# Patient Record
Sex: Female | Born: 1970 | Race: White | Hispanic: No | Marital: Married | Smoking: Never smoker
Health system: Southern US, Community
[De-identification: ages and names within clinical notes are randomized; demographics above are authoritative.]

## PROBLEM LIST (undated history)

## (undated) ENCOUNTER — Emergency Department: Payer: Self-pay

## (undated) DIAGNOSIS — I10 Essential (primary) hypertension: Secondary | ICD-10-CM

## (undated) DIAGNOSIS — O1490 Unspecified pre-eclampsia, unspecified trimester: Secondary | ICD-10-CM

## (undated) DIAGNOSIS — E282 Polycystic ovarian syndrome: Secondary | ICD-10-CM

## (undated) DIAGNOSIS — D352 Benign neoplasm of pituitary gland: Secondary | ICD-10-CM

## (undated) DIAGNOSIS — M4300 Spondylolysis, site unspecified: Secondary | ICD-10-CM

## (undated) DIAGNOSIS — E669 Obesity, unspecified: Secondary | ICD-10-CM

## (undated) DIAGNOSIS — Z8659 Personal history of other mental and behavioral disorders: Secondary | ICD-10-CM

## (undated) DIAGNOSIS — Z8759 Personal history of other complications of pregnancy, childbirth and the puerperium: Secondary | ICD-10-CM

## (undated) DIAGNOSIS — E1165 Type 2 diabetes mellitus with hyperglycemia: Secondary | ICD-10-CM

## (undated) DIAGNOSIS — K5730 Diverticulosis of large intestine without perforation or abscess without bleeding: Secondary | ICD-10-CM

## (undated) DIAGNOSIS — D1779 Benign lipomatous neoplasm of other sites: Secondary | ICD-10-CM

## (undated) HISTORY — DX: Unspecified pre-eclampsia, unspecified trimester: O14.90

## (undated) HISTORY — DX: Type 2 diabetes mellitus with hyperglycemia: E11.65

## (undated) HISTORY — DX: Polycystic ovarian syndrome: E28.2

## (undated) HISTORY — DX: Personal history of other mental and behavioral disorders: Z86.59

## (undated) HISTORY — DX: Personal history of other complications of pregnancy, childbirth and the puerperium: Z87.59

## (undated) HISTORY — DX: Obesity, unspecified: E66.9

## (undated) HISTORY — DX: Essential (primary) hypertension: I10

## (undated) HISTORY — DX: Benign neoplasm of pituitary gland: D35.2

---
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
## 2005-02-10 ENCOUNTER — Encounter: Admission: RE | Admit: 2005-02-10 | Discharge: 2005-02-10 | Payer: Self-pay | Admitting: Specialist

## 2009-04-28 ENCOUNTER — Ambulatory Visit: Payer: Self-pay | Admitting: Occupational Medicine

## 2009-04-28 DIAGNOSIS — L02619 Cutaneous abscess of unspecified foot: Secondary | ICD-10-CM | POA: Insufficient documentation

## 2009-04-28 DIAGNOSIS — L03039 Cellulitis of unspecified toe: Secondary | ICD-10-CM | POA: Insufficient documentation

## 2009-05-11 ENCOUNTER — Ambulatory Visit: Payer: Self-pay | Admitting: Occupational Medicine

## 2009-05-11 DIAGNOSIS — J019 Acute sinusitis, unspecified: Secondary | ICD-10-CM | POA: Insufficient documentation

## 2009-10-06 ENCOUNTER — Ambulatory Visit: Payer: Self-pay | Admitting: Family Medicine

## 2009-10-06 DIAGNOSIS — J029 Acute pharyngitis, unspecified: Secondary | ICD-10-CM | POA: Insufficient documentation

## 2009-10-07 ENCOUNTER — Encounter: Payer: Self-pay | Admitting: Family Medicine

## 2010-09-07 NOTE — Assessment & Plan Note (Signed)
Summary: Gail Black, EARACHE & SORE THROAT/KH   Vital Signs:  Patient Profile:   40 Years Old Female CC:      Fever, sore throat, ear congestion x 2 days Height:     68 inches Weight:      275 pounds O2 Sat:      97 % O2 treatment:    Room Air Temp:     99.0 degrees F oral Pulse rate:   90 / minute Pulse rhythm:   regular Resp:     16 per minute BP sitting:   149 / 104  (right arm) Cuff size:   large  Vitals Entered By: Emilio Math (October 06, 2009 10:31 AM)                  Current Allergies: No known allergies History of Present Illness Chief Complaint: Fever, sore throat, ear congestion x 2 days History of Present Illness: Subjective: Patient complains of sore throat for 2 days, now somewhat worse.  Developed right earache last night. No cough No pleuritic pain No wheezing + mild nasal congestion ? post-nasal drainage  No Gail Black pain/pressure No itchy/red eyes No hemoptysis No SOB No fever, + chills + nausea No vomiting No abdominal pain No diarrhea No skin rashes + fatigue No myalgias No headache    Current Meds * PRENATE once a day TYLENOL 325 MG TABS (ACETAMINOPHEN) 2 prn PENICILLIN V POTASSIUM 500 MG TABS (PENICILLIN V POTASSIUM) 1 by mouth q8hr for 10 days  REVIEW OF SYSTEMS Constitutional Symptoms       Complains of fever and chills.     Denies night sweats, weight loss, weight gain, and fatigue.  Eyes       Denies change in vision, eye pain, eye discharge, glasses, contact lenses, and eye surgery. Ear/Nose/Throat/Mouth       Complains of ear pain, Gail Black problems, and sore throat.      Denies hearing loss/aids, change in hearing, ear discharge, dizziness, frequent runny nose, frequent nose bleeds, hoarseness, and tooth pain or bleeding.  Respiratory       Denies dry cough, productive cough, wheezing, shortness of breath, asthma, bronchitis, and emphysema/COPD.  Cardiovascular       Denies murmurs, chest pain, and tires easily with exhertion.     Gastrointestinal       Denies stomach pain, nausea/vomiting, diarrhea, constipation, blood in bowel movements, and indigestion. Genitourniary       Denies painful urination, kidney stones, and loss of urinary control. Neurological       Denies paralysis, seizures, and fainting/blackouts. Musculoskeletal       Denies muscle pain, joint pain, joint stiffness, decreased range of motion, redness, swelling, muscle weakness, and gout.  Skin       Denies bruising, unusual mles/lumps or sores, and hair/skin or nail changes.  Psych       Denies mood changes, temper/anger issues, anxiety/stress, speech problems, depression, and sleep problems.  Past History:  Past Medical History: Reviewed history from 04/28/2009 and no changes required. Unremarkable  Past Surgical History: Reviewed history from 04/28/2009 and no changes required. c-section 01/06/09  Family History: Reviewed history from 05/11/2009 and no changes required. Mother, Healthy [place], Healthy  Social History: Reviewed history from 04/28/2009 and no changes required. Alcohol use-yes once or twice a month Drug use-no tobacco use - no   Objective:  No acute distress  Eyes:  Pupils are equal, round, and reactive to light and accomdation.  Extraocular movement is intact.  Conjunctivae are not inflamed.  Ears:  Canals normal.  Tympanic membranes normal.   Nose:  Normal septum.  Normal turbinates, mildly congested.   No Gail Black tenderness present.  Pharynx:  Erythema and exudate bilaterally Neck:  Supple.  Tender enlarged anterior nodes are palpated bilaterally.  Lungs:  Clear to auscultation.  Breath sounds are equal.  Heart:  Regular rate and rhythm without murmurs, rubs, or gallops.  Abdomen:  Nontender without masses or hepatosplenomegaly.  Bowel sounds are present.  No CVA or flank tenderness.  Rapid strep test negative  Assessment New Problems: ACUTE PHARYNGITIS (ICD-462)  Despite negative strep test, suspect either  Group A or non group A strep infection.  Plan New Medications/Changes: PENICILLIN V POTASSIUM 500 MG TABS (PENICILLIN V POTASSIUM) 1 by mouth q8hr for 10 days  #30 x 0, 10/06/2009, Donna Christen MD  New Orders: Rapid Strep [87880] T-Culture, Throat [[id number]] Est. Patient Level III [09811] Planning Comments:   Throat culture pending. Begin penicillin for 10 days.  Warm saline gargles.  Ibuprofen 600 or 800mg  every 8hr Follow-up with PCP if not improving   The patient and/or caregiver has been counseled thoroughly with regard to medications prescribed including dosage, schedule, interactions, rationale for use, and possible side effects and they verbalize understanding.  Diagnoses and expected course of recovery discussed and will return if not improved as expected or if the condition worsens. Patient and/or caregiver verbalized understanding.  Prescriptions: PENICILLIN V POTASSIUM 500 MG TABS (PENICILLIN V POTASSIUM) 1 by mouth q8hr for 10 days  #30 x 0   Entered and Authorized by:   Donna Christen MD   Signed by:   Donna Christen MD on 10/06/2009   Method used:   Print then Give to Patient   RxID:   [id number]   Patient Instructions: 1)  Take Ibuprofen 600 or 800mg  every 8 hours for pain/fever. 2)  Try warm salt water gargles

## 2010-09-07 NOTE — Letter (Signed)
Summary: Handout Printed  Printed Handout:  - Rheumatic Fever 

## 2011-04-20 ENCOUNTER — Ambulatory Visit (HOSPITAL_COMMUNITY): Payer: Self-pay | Admitting: Licensed Clinical Social Worker

## 2011-04-28 ENCOUNTER — Ambulatory Visit (HOSPITAL_COMMUNITY): Payer: Self-pay | Admitting: Licensed Clinical Social Worker

## 2011-05-02 ENCOUNTER — Encounter: Payer: Self-pay | Admitting: Family Medicine

## 2011-05-02 ENCOUNTER — Inpatient Hospital Stay (INDEPENDENT_AMBULATORY_CARE_PROVIDER_SITE_OTHER)
Admission: RE | Admit: 2011-05-02 | Discharge: 2011-05-02 | Disposition: A | Discharge status: Home / Self Care | Payer: Managed Care, Other (non HMO) | Source: Ambulatory Visit | Attending: Family Medicine | Admitting: Family Medicine

## 2011-05-02 DIAGNOSIS — J069 Acute upper respiratory infection, unspecified: Secondary | ICD-10-CM

## 2011-05-02 DIAGNOSIS — L02519 Cutaneous abscess of unspecified hand: Secondary | ICD-10-CM

## 2011-05-06 ENCOUNTER — Encounter: Payer: Self-pay | Admitting: Family Medicine

## 2011-07-11 NOTE — Progress Notes (Signed)
Summary: Followu Call  Followup Call to patient:  Discussed wound culture results.  She reports that her hand is improving.  She states that she has an appt with Hydrographic surveyor at [facility].  Advised to continue Keflex. Donna Christen MD  May 06, 2011 9:09 AM

## 2011-07-11 NOTE — Progress Notes (Signed)
Summary: COLD/GLASS IN HAND.[place] Room 5   Vital Signs:  Patient Profile:   40 Years Old Female CC:      Congestion, cough x 4 days/ Foreign body in left hand since December Height:     68 inches Weight:      265 pounds O2 Sat:      96 % O2 treatment:    Room Air Temp:     98.3 degrees F oral Pulse rate:   83 / minute Pulse rhythm:   regular Resp:     18 per minute BP sitting:   194 / 127  (left arm) Cuff size:   large  Vitals Entered By: Emilio Math (May 02, 2011 8:25 PM)                  Current Allergies: No known allergies History of Present Illness Chief Complaint: Congestion, cough x 4 days/ Foreign body in left hand since December History of Present Illness:  Subjective:  Patient presents with two complaints:  1)  About 9 months ago while cleaning a glass, fragments broke off and pentrated her left palm.  She believed that she had removed the fragments at the time of injury, but has had a persistent tender nodule at the site.  About one month ago she developed increased pain and redness at the site, and purulent drainage over the past week.  When she palpates the area there is a sharp pain suggesting a retained piece of glass.  Her tetanus immunization is current. 2)  Two days ago she developed cold-like symptoms with sore throat, nasal congestion, and a cough.  She has had a low grade fever.  No pleuritic pain or shortness of breath.  No earache.  No GU or GI symptoms.  Several family members have similar illness.  Current Meds * PRENATE once a day TYLENOL 325 MG TABS (ACETAMINOPHEN) 2 prn CEPHALEXIN 500 MG TABS (CEPHALEXIN) One by mouth three times daily (every 8 hours)  REVIEW OF SYSTEMS Constitutional Symptoms      Denies fever, chills, night sweats, weight loss, weight gain, and fatigue.  Eyes       Denies change in vision, eye pain, eye discharge, glasses, contact lenses, and eye surgery. Ear/Nose/Throat/Mouth       Complains of sinus problems.      Denies hearing loss/aids, change in hearing, ear pain, ear discharge, dizziness, frequent runny nose, frequent nose bleeds, sore throat, hoarseness, and tooth pain or bleeding.  Respiratory       Denies dry cough, productive cough, wheezing, shortness of breath, asthma, bronchitis, and emphysema/COPD.  Cardiovascular       Denies murmurs, chest pain, and tires easily with exhertion.    Gastrointestinal       Denies stomach pain, nausea/vomiting, diarrhea, constipation, blood in bowel movements, and indigestion. Genitourniary       Denies painful urination, kidney stones, and loss of urinary control. Neurological       Denies paralysis, seizures, and fainting/blackouts. Musculoskeletal       Denies muscle pain, joint pain, joint stiffness, decreased range of motion, redness, swelling, muscle weakness, and gout.  Skin       Complains of unusual moles/lumps or sores.      Denies bruising and hair/skin or nail changes.  Psych       Denies mood changes, temper/anger issues, anxiety/stress, speech problems, depression, and sleep problems. Other Comments: Takes something for Blood pressure   Past History:  Past Medical History: Reviewed history from 04/28/2009 and no changes required. Unremarkable  Past Surgical History: Reviewed history  from 04/28/2009 and no changes required. c-section 01/06/09  Family History: Reviewed history from 05/11/2009 and no changes required. Mother, Healthy [place], Healthy  Social History: Reviewed history from 04/28/2009 and no changes required. Alcohol use-yes once or twice a month Drug use-no tobacco use - no   Objective:  No acute distress  Eyes:  Pupils are equal, round, and reactive to light and accomodation.  Extraocular movement is intact.  Conjunctivae are not inflamed.  Ears:  Canals normal.  Tympanic membranes normal.   Nose:  Mildly congested turbinates.  No sinus tenderness  Pharynx:  Normal  Neck:  Supple.  Slightly tender shotty posterior nodes are palpated bilaterally.  Lungs:  Clear to auscultation.  Breath sounds are equal.  Chest:  Mild tenderness over sternum Heart:  Regular rate and rhythm without murmurs, rubs, or gallops.  Abdomen:  Nontender without masses or hepatosplenomegaly.  Bowel sounds are present.  No CVA or flank tenderness.  Left hand: 1cm dia tender erythematous indurated nodule on palm.  Small amount of sero-sanguinous fluid expressed.  All figers have good range of motion.  Distal neurovascular intact  Assessment New Problems: UPPER RESPIRATORY INFECTION, ACUTE (ICD-465.9) CELLULITIS, HAND, LEFT (ICD-682.4)  ? FOREIGN BODY (GLASS) LEFT PALM VIRAL URI WITH COSTOCHONDRITIS  Plan New Medications/Changes: CEPHALEXIN 500 MG TABS (CEPHALEXIN) One by mouth three times daily (every 8 hours)  #30 x 0, 05/02/2011, Donna Christen MD  New Orders: T-Culture, Wound [[id number]] Pulse Oximetry (single measurment) [94760] Est. Patient Level IV [16109] Planning Comments:   Begin Keflex.  Increase fluid intake.  May continue Afrin and saline nasal spray (she is breast feeding) Wound culture taken of lesion left palm. Begin soaking hand in warm water several times daily.  Keep bandage on lesion Followup with hand surgeon in about one week when cellulitis hand resolved.   The patient and/or caregiver has been counseled thoroughly with regard to medications prescribed including dosage, schedule, interactions, rationale for use, and possible side effects and they verbalize understanding.  Diagnoses and expected course of recovery discussed and will return if not improved as expected or if the condition worsens. Patient and/or caregiver verbalized understanding.  Prescriptions: CEPHALEXIN 500 MG TABS (CEPHALEXIN) One by mouth three times daily (every 8 hours)  #30 x 0   Entered and Authorized by:   Donna Christen MD   Signed by:   Donna Christen MD on 05/02/2011   Method used:   Print then Give to Patient   RxID:   [id number]   Orders Added: 1)  T-Culture, Wound [[id number]] 2)  Pulse Oximetry (single measurment) [94760] 3)  Est. Patient Level IV [95621]

## 2016-11-11 ENCOUNTER — Ambulatory Visit: Payer: Managed Care, Other (non HMO) | Admitting: Physician Assistant

## 2016-11-14 ENCOUNTER — Ambulatory Visit: Payer: Managed Care, Other (non HMO) | Admitting: Physician Assistant

## 2016-11-15 ENCOUNTER — Ambulatory Visit (INDEPENDENT_AMBULATORY_CARE_PROVIDER_SITE_OTHER): Payer: 59 | Admitting: Physician Assistant

## 2016-11-15 ENCOUNTER — Encounter: Payer: Self-pay | Admitting: Physician Assistant

## 2016-11-15 VITALS — BP 230/155 | HR 96 | Ht 67.0 in | Wt 296.0 lb

## 2016-11-15 DIAGNOSIS — E282 Polycystic ovarian syndrome: Secondary | ICD-10-CM

## 2016-11-15 DIAGNOSIS — Z299 Encounter for prophylactic measures, unspecified: Secondary | ICD-10-CM

## 2016-11-15 DIAGNOSIS — I10 Essential (primary) hypertension: Secondary | ICD-10-CM

## 2016-11-15 LAB — CBC
HCT: 40.8 % (ref 35.0–45.0)
HEMOGLOBIN: 13.8 g/dL (ref 11.7–15.5)
MCH: 27.1 pg (ref 27.0–33.0)
MCHC: 33.8 g/dL (ref 32.0–36.0)
MCV: 80 fL (ref 80.0–100.0)
MPV: 10.4 fL (ref 7.5–12.5)
Platelets: 204 10*3/uL (ref 140–400)
RBC: 5.1 MIL/uL (ref 3.80–5.10)
RDW: 14.4 % (ref 11.0–15.0)
WBC: 8.1 10*3/uL (ref 3.8–10.8)

## 2016-11-15 MED ORDER — VALSARTAN-HYDROCHLOROTHIAZIDE 320-25 MG PO TABS: 1.0000 | ORAL_TABLET | Freq: Every day | ORAL | 3 refills | Status: DC

## 2016-11-15 NOTE — Progress Notes (Signed)
HPI:                                                                Gail Black is a 46 y.o. female who presents to [facility] today to establish care   Current Concerns include blood pressure  HTN: Patient states she was diagnosed with HTN in the past, but has never followed up or taken antihypertensive medication. States she was referred here after having very elevated BP at her pre-employment physical this month. Patient reports history of preeclampsia 5 years ago requiring Magnesium and C-section; denies seizure/eclampsia. She states at that time she had a headache. Reports BP's have been elevated at her gynecologists office ever since her diagnosis of preeclampsia. She is asymptomatic today. Denies vision change, headache, chest pain with exertion, orthopnea, lightheadedness, syncope and edema.   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 States she has annual brain MRI's. Last MRI visible in [place] is from 03/16/11 and states pituitary gland is normal in size and appearance with no evidence of adenoma.   Health Maintenance Health Maintenance  Topic Date Due  . HIV Screening  01/23/1988  . TETANUS/TDAP  01/23/1992  . PAP SMEAR  01/22/1994  . INFLUENZA VACCINE  03/08/2017    GYN/Sexual Health  Menstrual status: having periods  LMP: 11/10/16  Menses: regular, PCOS  Last pap smear: 2017 - followed by GYN  History of abnormal pap smears: no  Sexually active: yes, 1 female partner (husband)  Current contraception: tubal  Health Habits  Diet: poor, 2 cups of coffee per day  Exercise: treadmill, walking 1h 2-3 days per week  Past Medical History:  Diagnosis Date  . 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 PCOS (polycystic ovarian syndrome)   . Preeclampsia    Past Surgical History:  Procedure Laterality Date  . CESAREAN SECTION  2013   preeclampsia  . CESAREAN SECTION WITH BILATERAL TUBAL LIGATION     Social History  Substance Use Topics  . Smoking status: Never Smoker  . Smokeless tobacco: Never Used  . Alcohol use Yes   family history includes Breast cancer in her mother; Hypertension in her father.  ROS: negative except as noted in the HPI  Medications: Current Outpatient Prescriptions  Medication Sig Dispense Refill  . valsartan-hydrochlorothiazide (DIOVAN-HCT) 320-25 MG tablet Take 1 tablet by mouth daily. 30 tablet 3   No current facility-administered medications for this visit.    No Known Allergies  Objective:  BP (!) 230/155   Pulse 96   Ht 5\' 7"  (1.702 m)   Wt 296 lb (134.3 kg)   BMI 46.36 kg/m  Gen: well-groomed, morbidly obese, cooperative, not ill-appearing, no distress Pulm: Normal work of breathing, normal phonation, clear to auscultation bilaterally CV: Normal rate, regular rhythm, s1 and s2 distinct, no murmurs, clicks or rubs, no carotid bruit Neuro: alert and oriented x 3, EOM's intact, PERRLA, normal tone, no tremor MSK: moving all extremities, normal gait and station, no peripheral edema Skin: warm and dry, no rashes or lesions on exposed skin Psych: normal affect, euthymic mood, normal speech and thought content  Assessment and Plan: 46 y.o. female with  1. Essential hypertension - BP very elevated today. Patient is asymptomatic. Checking CMP, TSH - starting 1/2 tab of Diovan-HCT with close follow-up in 1 week - patient instructed to monitor pressures at home - patient instructed to return or go to the emergency room if she develops symptoms - provided with DASH eating plan - valsartan-hydrochlorothiazide (DIOVAN-HCT) 320-25 MG tablet; Take 1 tablet by mouth daily.  Dispense: 30 tablet; Refill: 3  2. Encounter for preventive measure - CBC - COMPLETE METABOLIC PANEL WITH GFR - Lipid Panel w/reflex Direct LDL - TSH - MM DIGITAL SCREENING BILATERAL; Future - Pap UTD per  patient - will have records transferred from GYN  3. Severe obesity (BMI >= 40) (HCC) - Amb ref to Medical Nutrition Therapy-MNT - follow-up in 1 week to discuss weight loss  Patient education and anticipatory guidance given Patient agrees with treatment plan Follow-up in 1 week or sooner as needed  Darlyne Russian PA-C

## 2016-11-15 NOTE — Patient Instructions (Addendum)
For your blood pressure: - Take 1/2 tablet daily  - Check blood pressure at home  - Check around the same time each day in a relaxed setting (rest for at least 3 minutes, legs uncrossed, arm above level or your heart) - Limit salt. Follow DASH eating plan - Follow-up in 1 week   DASH Eating Plan DASH stands for "Dietary Approaches to Stop Hypertension." The DASH eating plan is a healthy eating plan that has been shown to reduce high blood pressure (hypertension). It may also reduce your risk for type 2 diabetes, heart disease, and stroke. The DASH eating plan may also help with weight loss. What are tips for following this plan? General guidelines   Avoid eating more than 2,300 mg (milligrams) of salt (sodium) a day. If you have hypertension, you may need to reduce your sodium intake to 1,500 mg a day.  Limit alcohol intake to no more than 1 drink a day for nonpregnant women and 2 drinks a day for men. One drink equals 12 oz of beer, 5 oz of wine, or 1 oz of hard liquor.  Work with your health care provider to maintain a healthy body weight or to lose weight. Ask what an ideal weight is for you.  Get at least 30 minutes of exercise that causes your heart to beat faster (aerobic exercise) most days of the week. Activities may include walking, swimming, or biking.  Work with your health care provider or diet and nutrition specialist (dietitian) to adjust your eating plan to your individual calorie needs. Reading food labels   Check food labels for the amount of sodium per serving. Choose foods with less than 5 percent of the Daily Value of sodium. Generally, foods with less than 300 mg of sodium per serving fit into this eating plan.  To find whole grains, look for the word "whole" as the first word in the ingredient list. Shopping   Buy products labeled as "low-sodium" or "no salt added."  Buy fresh foods. Avoid canned foods and premade or frozen meals. Cooking   Avoid adding salt  when cooking. Use salt-free seasonings or herbs instead of table salt or sea salt. Check with your health care provider or pharmacist before using salt substitutes.  Do not fry foods. Cook foods using healthy methods such as baking, boiling, grilling, and broiling instead.  Cook with heart-healthy oils, such as olive, canola, soybean, or sunflower oil. Meal planning    Eat a balanced diet that includes:  5 or more servings of fruits and vegetables each day. At each meal, try to fill half of your plate with fruits and vegetables.  Up to 6-8 servings of whole grains each day.  Less than 6 oz of lean meat, poultry, or fish each day. A 3-oz serving of meat is about the same size as a deck of cards. One egg equals 1 oz.  2 servings of low-fat dairy each day.  A serving of nuts, seeds, or beans 5 times each week.  Heart-healthy fats. Healthy fats called Omega-3 fatty acids are found in foods such as flaxseeds and coldwater fish, like sardines, salmon, and mackerel.  Limit how much you eat of the following:  Canned or prepackaged foods.  Food that is high in trans fat, such as fried foods.  Food that is high in saturated fat, such as fatty meat.  Sweets, desserts, sugary drinks, and other foods with added sugar.  Full-fat dairy products.  Do not salt foods before eating.  Try to eat at least 2 vegetarian meals each week.  Eat more home-cooked food and less restaurant, buffet, and fast food.  When eating at a restaurant, ask that your food be prepared with less salt or no salt, if possible. What foods are recommended? The items listed may not be a complete list. Talk with your dietitian about what dietary choices are best for you. Grains  Whole-grain or whole-wheat bread. Whole-grain or whole-wheat pasta. Brown rice. [place]. Bulgur. Whole-grain and low-sodium cereals. Pita bread. Low-fat, low-sodium crackers. Whole-wheat flour tortillas. Vegetables  Fresh or frozen  vegetables (raw, steamed, roasted, or grilled). Low-sodium or reduced-sodium tomato and vegetable juice. Low-sodium or reduced-sodium tomato sauce and tomato paste. Low-sodium or reduced-sodium canned vegetables. Fruits  All fresh, dried, or frozen fruit. Canned fruit in natural juice (without added sugar). Meat and other protein foods  Skinless chicken or [place]. Ground chicken or [place]. Pork with fat trimmed off. Fish and seafood. Egg whites. Dried beans, peas, or lentils. Unsalted nuts, nut butters, and seeds. Unsalted canned beans. Lean cuts of beef with fat trimmed off. Low-sodium, lean deli meat. Dairy  Low-fat (1%) or fat-free (skim) milk. Fat-free, low-fat, or reduced-fat cheeses. Nonfat, low-sodium ricotta or cottage cheese. Low-fat or nonfat yogurt. Low-fat, low-sodium cheese. Fats and oils  Soft margarine without trans fats. Vegetable oil. Low-fat, reduced-fat, or light mayonnaise and salad dressings (reduced-sodium). Canola, safflower, olive, soybean, and sunflower oils. Avocado. Seasoning and other foods  Herbs. Spices. Seasoning mixes without salt. Unsalted popcorn and pretzels. Fat-free sweets. What foods are not recommended? The items listed may not be a complete list. Talk with your dietitian about what dietary choices are best for you. Grains  Baked goods made with fat, such as croissants, muffins, or some breads. Dry pasta or rice meal packs. Vegetables  Creamed or fried vegetables. Vegetables in a cheese sauce. Regular canned vegetables (not low-sodium or reduced-sodium). Regular canned tomato sauce and paste (not low-sodium or reduced-sodium). Regular tomato and vegetable juice (not low-sodium or reduced-sodium). Angie Fava. Olives. Fruits  Canned fruit in a light or heavy syrup. Fried fruit. Fruit in cream or butter sauce. Meat and other protein foods  Fatty cuts of meat. Ribs. Fried meat. Berniece Salines. Sausage. Bologna and other processed lunch meats. Salami. Fatback. Hotdogs.  Bratwurst. Salted nuts and seeds. Canned beans with added salt. Canned or smoked fish. Whole eggs or egg yolks. Chicken or [place] with skin. Dairy  Whole or 2% milk, cream, and half-and-half. Whole or full-fat cream cheese. Whole-fat or sweetened yogurt. Full-fat cheese. Nondairy creamers. Whipped toppings. Processed cheese and cheese spreads. Fats and oils  Butter. Stick margarine. Lard. Shortening. Ghee. Bacon fat. Tropical oils, such as coconut, palm kernel, or palm oil. Seasoning and other foods  Salted popcorn and pretzels. Onion salt, garlic salt, seasoned salt, table salt, and sea salt. Worcestershire sauce. Tartar sauce. Barbecue sauce. Teriyaki sauce. Soy sauce, including reduced-sodium. Steak sauce. Canned and packaged gravies. Fish sauce. Oyster sauce. Cocktail sauce. Horseradish that you find on the shelf. Ketchup. Mustard. Meat flavorings and tenderizers. Bouillon cubes. Hot sauce and Tabasco sauce. Premade or packaged marinades. Premade or packaged taco seasonings. Relishes. Regular salad dressings. Where to find more information:  National Heart, Lung, and [place]: https://wilson-eaton.com/  American Heart Association: www.heart.org Summary  The DASH eating plan is a healthy eating plan that has been shown to reduce high blood pressure (hypertension). It may also reduce your risk for type 2 diabetes, heart disease, and stroke.  With the DASH  eating plan, you should limit salt (sodium) intake to 2,300 mg a day. If you have hypertension, you may need to reduce your sodium intake to 1,500 mg a day.  When on the DASH eating plan, aim to eat more fresh fruits and vegetables, whole grains, lean proteins, low-fat dairy, and heart-healthy fats.  Work with your health care provider or diet and nutrition specialist (dietitian) to adjust your eating plan to your individual calorie needs. This information is not intended to replace advice given to you by your health care provider. Make sure  you discuss any questions you have with your health care provider. Document Released: 07/14/2011 Document Revised: 07/18/2016 Document Reviewed: 07/18/2016 Elsevier Interactive Patient Education  2017 [employer].

## 2016-11-16 ENCOUNTER — Encounter: Payer: Self-pay | Admitting: Physician Assistant

## 2016-11-16 DIAGNOSIS — I10 Essential (primary) hypertension: Secondary | ICD-10-CM | POA: Insufficient documentation

## 2016-11-16 DIAGNOSIS — E282 Polycystic ovarian syndrome: Secondary | ICD-10-CM | POA: Insufficient documentation

## 2016-11-16 LAB — COMPLETE METABOLIC PANEL WITH GFR
ALBUMIN: 4.2 g/dL (ref 3.6–5.1)
ALK PHOS: 62 U/L (ref 33–115)
ALT: 25 U/L (ref 6–29)
AST: 15 U/L (ref 10–30)
BILIRUBIN TOTAL: 0.5 mg/dL (ref 0.2–1.2)
BUN: 14 mg/dL (ref 7–25)
CO2: 25 mmol/L (ref 20–31)
CREATININE: 0.77 mg/dL (ref 0.50–1.10)
Calcium: 9.1 mg/dL (ref 8.6–10.2)
Chloride: 103 mmol/L (ref 98–110)
GFR, Est African American: >89 mL/min (ref >=60)
GLUCOSE: 154 mg/dL — AB (ref 65–99)
Potassium: 3.7 mmol/L (ref 3.5–5.3)
SODIUM: 139 mmol/L (ref 135–146)
TOTAL PROTEIN: 7.1 g/dL (ref 6.1–8.1)

## 2016-11-16 LAB — LIPID PANEL W/REFLEX DIRECT LDL
Cholesterol: 198 mg/dL (ref <200)
HDL: 46 mg/dL — ABNORMAL LOW (ref >50)
LDL-CHOLESTEROL: 124 mg/dL — AB
Non-HDL Cholesterol (Calc): 152 mg/dL — ABNORMAL HIGH (ref <130)
Total CHOL/HDL Ratio: 4.3 Ratio (ref <5.0)
Triglycerides: 167 mg/dL — ABNORMAL HIGH (ref <150)

## 2016-11-16 LAB — TSH: TSH: 2.55 m[IU]/L

## 2016-11-22 ENCOUNTER — Ambulatory Visit (INDEPENDENT_AMBULATORY_CARE_PROVIDER_SITE_OTHER): Payer: 59 | Admitting: Physician Assistant

## 2016-11-22 VITALS — BP 196/133 | HR 84 | Wt 300.0 lb

## 2016-11-22 DIAGNOSIS — E282 Polycystic ovarian syndrome: Secondary | ICD-10-CM | POA: Diagnosis not present

## 2016-11-22 DIAGNOSIS — Z79899 Other long term (current) drug therapy: Secondary | ICD-10-CM

## 2016-11-22 DIAGNOSIS — I10 Essential (primary) hypertension: Secondary | ICD-10-CM | POA: Diagnosis not present

## 2016-11-22 DIAGNOSIS — L680 Hirsutism: Secondary | ICD-10-CM | POA: Diagnosis not present

## 2016-11-22 DIAGNOSIS — Z5181 Encounter for therapeutic drug level monitoring: Secondary | ICD-10-CM | POA: Diagnosis not present

## 2016-11-22 MED ORDER — SPIRONOLACTONE 50 MG PO TABS: 50.0000 mg | ORAL_TABLET | Freq: Every day | ORAL | 3 refills | Status: DC

## 2016-11-22 MED ORDER — METFORMIN HCL ER 500 MG PO TB24: 500.0000 mg | ORAL_TABLET | Freq: Every day | ORAL | 3 refills | Status: DC

## 2016-11-22 NOTE — Patient Instructions (Addendum)
For your blood pressure: - Increase to a full tab daily - Cont to check blood pressure at home for the next 2 weeks - Check around the same time each day in a relaxed setting (rest for at least 3 minutes, legs uncrossed, arm above level or your heart) - Limit salt. Follow DASH eating plan - Follow-up for a nurse visit BP check in 2 weeks   - Take Metformin with your evening meal  - Take Spironolactone 1/2 tab twice a day

## 2016-11-22 NOTE — Progress Notes (Signed)
HPI:                                                                Gail Black is a 46 y.o. female who presents to [place]: [place] today for blood pressure follow-up  HTN: taking Valsartan-HCTZ daily. Compliant with medications. Checks BP's at home. BP range 180-210/88-101. Denies vision change, headache, chest pain with exertion, orthopnea, lightheadedness, syncope and edema.   Obesity (BMI 46): Patient states she has started going to the gym and eating a healthier diet. She is cooking all of her meals at home. She does endorse consuming a few glasses of wine over the weekend. She has an appointment with nutritionist early next month.  Past Medical History:  Diagnosis Date  . 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 Hypertension   . Obesity   . PCOS (polycystic ovarian syndrome)   . Preeclampsia    Past Surgical History:  Procedure Laterality Date  . CESAREAN SECTION  2013   preeclampsia  . CESAREAN SECTION WITH BILATERAL TUBAL LIGATION     Social History  Substance Use Topics  . Smoking status: Never Smoker  . Smokeless tobacco: Never Used  . Alcohol use 0.6 oz/week    1 Glasses of wine per week   family history includes Breast cancer in her mother; Hypertension in her father.  ROS: negative except as noted in the HPI  Medications: Current Outpatient Prescriptions  Medication Sig Dispense Refill  . valsartan-hydrochlorothiazide (DIOVAN-HCT) 320-25 MG tablet Take 1 tablet by mouth daily. (Patient taking differently: Take 0.5 tablets by mouth daily. ) 30 tablet 3  . metFORMIN (GLUCOPHAGE XR) 500 MG 24 hr tablet Take 1 tablet (500 mg total) by mouth daily with supper. 30 tablet 3  . spironolactone (ALDACTONE) 50 MG tablet Take 1 tablet (50 mg total) by mouth daily. 30 tablet 3   No current facility-administered medications for this visit.    No Known Allergies     Objective:  BP (!) 196/133   Pulse 84   Wt 300 lb  (136.1 kg)   BMI 46.99 kg/m  Gen: well-groomed, cooperative, not ill-appearing, no distress Pulm: Normal work of breathing, normal phonation, clear to auscultation bilaterally CV: Normal rate, regular rhythm, s1 and s2 distinct, no murmurs, clicks or rubs  Neuro: alert and oriented x 3, EOM's intact, no tremor MSK: moving all extremities, normal gait and station, no peripheral edema Psych: good eye contact, normal affect, euthymic mood, normal speech and thought content Skin: acne present on chin and submental region    No results found for this or any previous visit (from the past 72 hour(s)). No results found.    Assessment and Plan: 46 y.o. female with   1. Essential hypertension - BP still out of range on both home and clinic readings. She is asymptomatic. - increasing Valsaran-HCT to 320-25mg  daily - follow-up in 2 weeks  2. PCOS (polycystic ovarian syndrome) - metFORMIN (GLUCOPHAGE XR) 500 MG 24 hr tablet; Take 1 tablet (500 mg total) by mouth daily with supper.  Dispense: 30 tablet; Refill: 3  3. Severe obesity (BMI >= 40) (HCC) - metFORMIN (GLUCOPHAGE XR) 500 MG 24 hr tablet; Take 1 tablet (500 mg total) by mouth  daily with supper.  Dispense: 30 tablet; Refill: 3 - follow-up with medical nutritionist - cont regular cardiovascular exercise  4. 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 This is causing her emotional distress and she has seen dermatology for this. She was prescribed some topical cream, but would like to try Spironolactone - spironolactone (ALDACTONE) 50 MG tablet; Take 1 tablet (50 mg total) by mouth daily.  Dispense: 30 tablet; Refill: 3 - educated patient on importance of good hydration since she is on two diuretics. Discussed risk of hypokalemia - will check potassium in 1 month   Patient education and anticipatory guidance given Patient agrees with treatment plan Follow-up in 2 weeks or sooner as needed   Darlyne Russian PA-C

## 2016-11-25 ENCOUNTER — Encounter: Payer: Self-pay | Admitting: Physician Assistant

## 2016-11-25 ENCOUNTER — Telehealth: Payer: Self-pay

## 2016-11-25 NOTE — Telephone Encounter (Signed)
Recommendations left on vm -EH/RMA  

## 2016-11-25 NOTE — Telephone Encounter (Signed)
-----   Message from [facility] [place] sent at 11/23/2016 10:28 AM EDT ----- Can you let patient know, since we started her on Spironolactone, I would like her to have her potassium checked in 4 weeks. She can pick up her lab slip at her nurse visit.

## 2016-12-06 ENCOUNTER — Ambulatory Visit (INDEPENDENT_AMBULATORY_CARE_PROVIDER_SITE_OTHER): Payer: 59 | Admitting: Physician Assistant

## 2016-12-06 ENCOUNTER — Other Ambulatory Visit: Payer: Self-pay

## 2016-12-06 VITALS — BP 196/104 | HR 102 | Wt 299.0 lb

## 2016-12-06 DIAGNOSIS — Z79899 Other long term (current) drug therapy: Secondary | ICD-10-CM

## 2016-12-06 DIAGNOSIS — I10 Essential (primary) hypertension: Secondary | ICD-10-CM

## 2016-12-06 DIAGNOSIS — Z5181 Encounter for therapeutic drug level monitoring: Secondary | ICD-10-CM

## 2016-12-06 MED ORDER — METOPROLOL SUCCINATE ER 50 MG PO TB24: 50.0000 mg | ORAL_TABLET | Freq: Every day | ORAL | 5 refills | Status: DC

## 2016-12-06 NOTE — Progress Notes (Signed)
Patient's BP still hypertensive and tachycardic on max dose of valsartan-hydrochlorothiazide daily. Adding Toprol XL 50mg  daily.

## 2016-12-06 NOTE — Progress Notes (Signed)
Pt came into clinic today for NV BP check. Pt reports at last OV she was advised to start taking a full tab of the Diovan-HCT 320-25mg . Pt reports no side effects. Denies any palpitations, chest pain, dizziness, blurred vision. Pt does state she feels as thought her blood sugar is dropping in the mornings. Advised she get a glucometer and start checking it in the am. Since Pt does not have diagnosis for DM, she will pay for this OOP. Pt's BP was elevated in office today, Pt also reports at home this am it was 201/90. Spoke with PCP, added Rx for Toprolol 50mg  daily. Pt advised of new Rx addition and to follow up in 2 weeks for NV BP check.

## 2016-12-07 LAB — BASIC METABOLIC PANEL WITH GFR
BUN: 22 mg/dL (ref 7–25)
CO2: 20 mmol/L (ref 20–31)
CREATININE: 1.05 mg/dL (ref 0.50–1.10)
Calcium: 9 mg/dL (ref 8.6–10.2)
Chloride: 105 mmol/L (ref 98–110)
GFR, EST AFRICAN AMERICAN: 75 mL/min (ref >=60)
GFR, Est Non African American: 65 mL/min (ref >=60)
Glucose, Bld: 121 mg/dL — ABNORMAL HIGH (ref 65–99)
Potassium: 4.1 mmol/L (ref 3.5–5.3)
Sodium: 137 mmol/L (ref 135–146)

## 2016-12-20 ENCOUNTER — Ambulatory Visit: Payer: 59

## 2016-12-22 ENCOUNTER — Ambulatory Visit: Payer: 59

## 2017-01-18 ENCOUNTER — Ambulatory Visit (INDEPENDENT_AMBULATORY_CARE_PROVIDER_SITE_OTHER): Payer: 59 | Admitting: Physician Assistant

## 2017-01-18 ENCOUNTER — Encounter: Payer: Self-pay | Admitting: Physician Assistant

## 2017-01-18 VITALS — BP 187/113 | HR 61 | Wt 306.0 lb

## 2017-01-18 DIAGNOSIS — F4323 Adjustment disorder with mixed anxiety and depressed mood: Secondary | ICD-10-CM | POA: Diagnosis not present

## 2017-01-18 DIAGNOSIS — I10 Essential (primary) hypertension: Secondary | ICD-10-CM

## 2017-01-18 MED ORDER — ALPRAZOLAM 1 MG PO TABS: 0.5000 mg | ORAL_TABLET | Freq: Two times a day (BID) | ORAL | 0 refills | Status: DC | PRN

## 2017-01-18 MED ORDER — VILAZODONE HCL 10 & 20 MG PO KIT: 1.0000 | PACK | Freq: Every day | ORAL | 0 refills | Status: DC

## 2017-01-18 NOTE — Patient Instructions (Addendum)
-   Plan to go to the lab downstairs at 7:30-8:00 am for blood work and urine sample. Drink plenty of water beforehand - You will get a phone call to schedule your ultrasound - Continue your daily medications - Follow-up in 2 weeks

## 2017-01-18 NOTE — Progress Notes (Signed)
HPI:                                                                Gail Black is a 46 y.o. female who presents to [place]: [place] today for HTN, anxiety  HTN: taking valsartan-hctz and Metoprolol XL daily. Compliant with medications. Checks BP's at home. Patient reports average readings 130's/80's, but does report multiple readings as high as 190/110. She does endorse occasional episodes where she becomes diaphoretic, has palpitations and feels anxious. Denies vision change, headache, chest pain with exertion, orthopnea, lightheadedness, syncope and edema. Risk factors include: severe obesity, prediabetes  Depression/Anxiety: patient reports stress at work and grief related to recent unexpected death of her father. She endorses severe anxiety that is interfering with her ability to perform her job. She endorses excessive worry, restlessness, palpitations, and sleep disturbance. Reports she has been on Zoloft in the past for postpartum depression. Denies symptoms of mania/hypomania. Denies suicidal thinking. Denies auditory/visual hallucinations.   Past Medical History:  Diagnosis Date  . 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 Hypertension   . Obesity   . PCOS (polycystic ovarian syndrome)   . Preeclampsia    Past Surgical History:  Procedure Laterality Date  . CESAREAN SECTION  2013   preeclampsia  . CESAREAN SECTION WITH BILATERAL TUBAL LIGATION     Social History  Substance Use Topics  . Smoking status: Never Smoker  . Smokeless tobacco: Never Used  . Alcohol use 0.6 oz/week    1 Glasses of wine per week   family history includes Breast cancer in her mother; Hypertension in her father.  ROS: negative except as noted in the HPI  Medications: Current Outpatient Prescriptions  Medication Sig Dispense Refill  . metFORMIN (GLUCOPHAGE XR) 500 MG 24 hr tablet Take 1 tablet (500 mg total) by mouth daily with supper. 30 tablet 3   . metoprolol succinate (TOPROL XL) 50 MG 24 hr tablet Take 1 tablet (50 mg total) by mouth daily. Take with or immediately following a meal. 30 tablet 5  . spironolactone (ALDACTONE) 50 MG tablet Take 1 tablet (50 mg total) by mouth daily. 30 tablet 3  . valsartan-hydrochlorothiazide (DIOVAN-HCT) 320-25 MG tablet Take 1 tablet by mouth daily. 30 tablet 3  . ALPRAZolam (XANAX) 1 MG tablet Take 0.5-1 tablets (0.5-1 mg total) by mouth 2 (two) times daily as needed for anxiety. 20 tablet 0  . Vilazodone HCl (VIIBRYD STARTER PACK) 10 & 20 MG KIT Take 1 tablet by mouth daily after breakfast. Use as directed, 4 weeks QS 1 kit 0   No current facility-administered medications for this visit.    No Known Allergies     Objective:  BP (!) 187/113   Pulse 61   Wt (!) 306 lb (138.8 kg)   BMI 47.93 kg/m  Gen:  Morbidly obese, not ill-appearing, no acute distress Pulm: Normal work of breathing, normal phonation, clear to auscultation bilaterally CV: Normal rate, regular rhythm, s1 and s2 distinct, no murmurs, clicks or rubs, no carotid bruit  Neuro: alert and oriented x 3, EOM's intact, no tremor MSK: moving all extremities, normal gait and station, no peripheral edema Psych: well-groomed, cooperative, good eye contact, tearful in the exam room, "  sad and stressed out" mood, affect mood-congruent, normal speech and thought content     No results found for this or any previous visit (from the past 72 hour(s)). No results found.  Depression screen PHQ 2/9 01/18/2017  Decreased Interest 2  Down, Depressed, Hopeless 3  PHQ - 2 Score 5  Altered sleeping 1  Tired, decreased energy 2  Change in appetite 3  Feeling bad or failure about yourself  3  Trouble concentrating 1  Moving slowly or fidgety/restless 1  Suicidal thoughts 0  PHQ-9 Score 16   GAD 7 : Generalized Anxiety Score 01/18/2017  Nervous, Anxious, on Edge 3  Control/stop worrying 3  Worry too much - different things 3  Trouble  relaxing 3  Restless 3  Easily annoyed or irritable 3  Afraid - awful might happen 3  Total GAD 7 Score 21  Anxiety Difficulty Extremely difficult   Assessment and Plan: 46 y.o. female with   1. Severe uncontrolled hypertension - patient continues to have uncontrolled stage II hypertension despite maximum doses of 2 antihypertensive (ARB, thiazide) medications and a beta blocker. Will proceed with work-up for secondary hypertension. DDx to include renovascular hypertension, pheochromocytoma and Cushing syndrome - VAS US RENAL ARTERY DUPLEX; Future - Aldosterone + renin activity w/ ratio - Cortisol, urine, free - Metanephrines, urine, 24 hour - Metanephrines, plasma  2. Severe obesity (BMI >= 40) (HCC) - continue Metformin - continue therapeutic lifestyle changes - working up for Cushing syndrome  3. Adjustment disorder with mixed anxiety and depressed mood - PHQ9 score 16, moderate; GAD7 score 21, severe - work note provided excusing patient from travel - patient has failed Zoloft in the past.  - Vilazodone HCl (VIIBRYD STARTER PACK) 10 & 20 MG KIT; Take 1 tablet by mouth daily after breakfast. Use as directed, 4 weeks QS  Dispense: 1 kit; Refill: 0 - ALPRAZolam (XANAX) 1 MG tablet; Take 0.5-1 tablets (0.5-1 mg total) by mouth 2 (two) times daily as needed for anxiety.  Dispense: 20 tablet; Refill: 0  Patient education and anticipatory guidance given Patient agrees with treatment plan Follow-up in 2 weeks or sooner as needed if symptoms worsen or fail to improve  Darlyne Russian PA-C

## 2017-01-20 LAB — CORTISOL, URINE, 24 HOUR

## 2017-01-20 LAB — METANEPHRINES, URINE, 24 HOUR

## 2017-01-22 ENCOUNTER — Encounter: Payer: Self-pay | Admitting: Physician Assistant

## 2017-01-22 LAB — METANEPHRINES, PLASMA
NORMETANEPHRINE FREE: 68 pg/mL (ref <=148)
TOTAL METANEPHRINES-PLASMA: 68 pg/mL (ref <=205)

## 2017-01-27 ENCOUNTER — Other Ambulatory Visit: Payer: Self-pay | Admitting: Physician Assistant

## 2017-01-30 ENCOUNTER — Other Ambulatory Visit: Payer: Self-pay

## 2017-01-30 DIAGNOSIS — L680 Hirsutism: Secondary | ICD-10-CM

## 2017-01-30 DIAGNOSIS — E282 Polycystic ovarian syndrome: Secondary | ICD-10-CM

## 2017-01-30 DIAGNOSIS — I10 Essential (primary) hypertension: Secondary | ICD-10-CM

## 2017-01-30 MED ORDER — VALSARTAN-HYDROCHLOROTHIAZIDE 320-25 MG PO TABS: 1.0000 | ORAL_TABLET | Freq: Every day | ORAL | 3 refills | Status: DC

## 2017-01-30 MED ORDER — METFORMIN HCL ER 500 MG PO TB24: 500.0000 mg | ORAL_TABLET | Freq: Every day | ORAL | 0 refills | Status: DC

## 2017-01-30 MED ORDER — SPIRONOLACTONE 50 MG PO TABS: 50.0000 mg | ORAL_TABLET | Freq: Every day | ORAL | 0 refills | Status: DC

## 2017-01-30 MED ORDER — METOPROLOL SUCCINATE ER 50 MG PO TB24: 50.0000 mg | ORAL_TABLET | Freq: Every day | ORAL | 0 refills | Status: DC

## 2017-01-31 ENCOUNTER — Ambulatory Visit (HOSPITAL_COMMUNITY): Payer: 59

## 2017-01-31 ENCOUNTER — Telehealth: Payer: Self-pay

## 2017-01-31 NOTE — Telephone Encounter (Signed)
Lab called stating that order for Aldosterone + renin activity w/ ratio is being cancelled due to an insufficient amount of plasma. The lab will need to be reordered and pt will need redraw. Please advise.

## 2017-02-01 ENCOUNTER — Ambulatory Visit (INDEPENDENT_AMBULATORY_CARE_PROVIDER_SITE_OTHER): Payer: 59 | Admitting: Physician Assistant

## 2017-02-01 ENCOUNTER — Encounter: Payer: Self-pay | Admitting: Physician Assistant

## 2017-02-01 VITALS — BP 152/88 | HR 52 | Resp 18 | Wt 307.8 lb

## 2017-02-01 DIAGNOSIS — Z8481 Family history of carrier of genetic disease: Secondary | ICD-10-CM

## 2017-02-01 DIAGNOSIS — E1165 Type 2 diabetes mellitus with hyperglycemia: Secondary | ICD-10-CM

## 2017-02-01 DIAGNOSIS — E785 Hyperlipidemia, unspecified: Secondary | ICD-10-CM

## 2017-02-01 DIAGNOSIS — R739 Hyperglycemia, unspecified: Secondary | ICD-10-CM | POA: Diagnosis not present

## 2017-02-01 DIAGNOSIS — E282 Polycystic ovarian syndrome: Secondary | ICD-10-CM | POA: Diagnosis not present

## 2017-02-01 DIAGNOSIS — N920 Excessive and frequent menstruation with regular cycle: Secondary | ICD-10-CM | POA: Diagnosis not present

## 2017-02-01 DIAGNOSIS — Z1231 Encounter for screening mammogram for malignant neoplasm of breast: Secondary | ICD-10-CM

## 2017-02-01 DIAGNOSIS — I10 Essential (primary) hypertension: Secondary | ICD-10-CM | POA: Diagnosis not present

## 2017-02-01 HISTORY — DX: Type 2 diabetes mellitus with hyperglycemia: E11.65

## 2017-02-01 LAB — POCT GLYCOSYLATED HEMOGLOBIN (HGB A1C): Hemoglobin A1C: 8.2

## 2017-02-01 LAB — METANEPHRINES, URINE, 24 HOUR
Metaneph Total, Ur: 68 mcg/24 h — ABNORMAL LOW (ref 182–739)
Metanephrines, Ur: 11 mcg/24 h — ABNORMAL LOW (ref 58–203)
Normetanephrine, 24H Ur: 57 mcg/24 h — ABNORMAL LOW (ref 88–649)

## 2017-02-01 LAB — ALDOSTERONE + RENIN ACTIVITY W/ RATIO

## 2017-02-01 MED ORDER — ATORVASTATIN CALCIUM 20 MG PO TABS: 20.0000 mg | ORAL_TABLET | Freq: Every day | ORAL | 3 refills | Status: DC

## 2017-02-01 MED ORDER — METFORMIN HCL ER 500 MG PO TB24: 1000.0000 mg | ORAL_TABLET | Freq: Two times a day (BID) | ORAL | 2 refills | Status: DC

## 2017-02-01 MED ORDER — ASPIRIN EC 81 MG PO TBEC: 81.0000 mg | DELAYED_RELEASE_TABLET | Freq: Every day | ORAL | 11 refills | Status: DC

## 2017-02-01 NOTE — Patient Instructions (Addendum)
For Diabetes: - Increase Metformin to 1000mg  twice a day - Start Atorvastatin daily or nightly to lower cholesterol (goal LDL <70) - Start baby aspirin 81 mg daily - Follow-up with Diabetes Educator - Limit carbohydrates and simple sugars - You will need to schedule an annual diabetic eye exam with an Ophthamologist - Return in 3 months for repeat A1C   Carbohydrate Counting for Diabetes Mellitus, Adult Carbohydrate counting is a method for keeping track of how many carbohydrates you eat. Eating carbohydrates naturally increases the amount of sugar (glucose) in the blood. Counting how many carbohydrates you eat helps keep your blood glucose within normal limits, which helps you manage your diabetes (diabetes mellitus). It is important to know how many carbohydrates you can safely have in each meal. This is different for every person. A diet and nutrition specialist (registered dietitian) can help you make a meal plan and calculate how many carbohydrates you should have at each meal and snack. Carbohydrates are found in the following foods:  Grains, such as breads and cereals.  Dried beans and soy products.  Starchy vegetables, such as potatoes, peas, and corn.  Fruit and fruit juices.  Milk and yogurt.  Sweets and snack foods, such as cake, cookies, candy, chips, and soft drinks.  How do I count carbohydrates? There are two ways to count carbohydrates in food. You can use either of the methods or a combination of both. Reading "Nutrition Facts" on packaged food The "Nutrition Facts" list is included on the labels of almost all packaged foods and beverages in the [place] It includes:  The serving size.  Information about nutrients in each serving, including the grams (g) of carbohydrate per serving.  To use the "Nutrition Facts":  Decide how many servings you will have.  Multiply the number of servings by the number of carbohydrates per serving.  The resulting number is the  total amount of carbohydrates that you will be having.  Learning standard serving sizes of other foods When you eat foods containing carbohydrates that are not packaged or do not include "Nutrition Facts" on the label, you need to measure the servings in order to count the amount of carbohydrates:  Measure the foods that you will eat with a food scale or measuring cup, if needed.  Decide how many standard-size servings you will eat.  Multiply the number of servings by 15. Most carbohydrate-rich foods have about 15 g of carbohydrates per serving. ? For example, if you eat 8 oz (170 g) of strawberries, you will have eaten 2 servings and 30 g of carbohydrates (2 servings x 15 g = 30 g).  For foods that have more than one food mixed, such as soups and casseroles, you must count the carbohydrates in each food that is included.  The following list contains standard serving sizes of common carbohydrate-rich foods. Each of these servings has about 15 g of carbohydrates:   hamburger bun or  English muffin.   oz (15 mL) syrup.   oz (14 g) jelly.  1 slice of bread.  1 six-inch tortilla.  3 oz (85 g) cooked rice or pasta.  4 oz (113 g) cooked dried beans.  4 oz (113 g) starchy vegetable, such as peas, corn, or potatoes.  4 oz (113 g) hot cereal.  4 oz (113 g) mashed potatoes or  of a large baked potato.  4 oz (113 g) canned or frozen fruit.  4 oz (120 mL) fruit juice.  4-6 crackers.  6  chicken nuggets.  6 oz (170 g) unsweetened dry cereal.  6 oz (170 g) plain fat-free yogurt or yogurt sweetened with artificial sweeteners.  8 oz (240 mL) milk.  8 oz (170 g) fresh fruit or one small piece of fruit.  24 oz (680 g) popped popcorn.  Example of carbohydrate counting Sample meal  3 oz (85 g) chicken breast.  6 oz (170 g) brown rice.  4 oz (113 g) corn.  8 oz (240 mL) milk.  8 oz (170 g) strawberries with sugar-free whipped topping. Carbohydrate  calculation 1. Identify the foods that contain carbohydrates: ? Rice. ? Corn. ? Milk. ? Strawberries. 2. Calculate how many servings you have of each food: ? 2 servings rice. ? 1 serving corn. ? 1 serving milk. ? 1 serving strawberries. 3. Multiply each number of servings by 15 g: ? 2 servings rice x 15 g = 30 g. ? 1 serving corn x 15 g = 15 g. ? 1 serving milk x 15 g = 15 g. ? 1 serving strawberries x 15 g = 15 g. 4. Add together all of the amounts to find the total grams of carbohydrates eaten: ? 30 g + 15 g + 15 g + 15 g = 75 g of carbohydrates total. This information is not intended to replace advice given to you by your health care provider. Make sure you discuss any questions you have with your health care provider. Document Released: 07/25/2005 Document Revised: 02/12/2016 Document Reviewed: 01/06/2016 Elsevier Interactive Patient Education  [employer].

## 2017-02-01 NOTE — Progress Notes (Signed)
HPI:                                                                Gail Black is a 46 y.o. female who presents to [place] [place] today for HTN follow-up  HTN: taking Metoprolol, Valsartan-HCTZ daily. Compliant with medications. Checks BP's at home. Reports BP's have been 130/80 or less since last follow-up. Denies vision change, headache, chest pain with exertion, orthopnea, lightheadedness, syncope and edema.   Obesity: patient has been working on diet and walking daily. She feels she is too heavy to increase physical activity beyond walking. She has cut out sugars and sweets. She is requesting referral to bariatric surgery  PCOS: has been on Metformin XR '500mg'$  nightly. Has noted heavier periods again lately. Would like a referral to a gynecologist to establish care.  Patient also reports a strong family history of breast cancer. She states both her mother and 1 sister have breast cancer and that genetic testing has been positive in her family. She has never had a mammogram.  Past Medical History:  Diagnosis Date  . 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 History of postpartum depression   . Hypertension   . Obesity   . PCOS (polycystic ovarian syndrome)   . Preeclampsia   . Type 2 diabetes mellitus with hyperglycemia, without long-term current use of insulin ([place]) 02/01/2017   Past Surgical History:  Procedure Laterality Date  . CESAREAN SECTION  2013   preeclampsia  . CESAREAN SECTION WITH BILATERAL TUBAL LIGATION     Social History  Substance Use Topics  . Smoking status: Never Smoker  . Smokeless tobacco: Never Used  . Alcohol use 0.6 oz/week    1 Glasses of wine per week   family history includes Breast cancer in her mother; Hypertension in her father.  ROS: negative except as noted in the HPI  Medications: Current Outpatient Prescriptions  Medication Sig Dispense Refill  . ALPRAZolam (XANAX) 1 MG tablet Take 0.5-1  tablets (0.5-1 mg total) by mouth 2 (two) times daily as needed for anxiety. 20 tablet 0  . aspirin EC 81 MG tablet Take 1 tablet (81 mg total) by mouth daily. 30 tablet 11  . atorvastatin (LIPITOR) 20 MG tablet Take 1 tablet (20 mg total) by mouth daily. 90 tablet 3  . metFORMIN (GLUCOPHAGE XR) 500 MG 24 hr tablet Take 2 tablets (1,000 mg total) by mouth 2 (two) times daily with a meal. 120 tablet 2  . metoprolol succinate (TOPROL XL) 50 MG 24 hr tablet Take 1 tablet (50 mg total) by mouth daily. Take with or immediately following a meal. 90 tablet 0  . spironolactone (ALDACTONE) 50 MG tablet Take 1 tablet (50 mg total) by mouth daily. 90 tablet 0  . valsartan-hydrochlorothiazide (DIOVAN-HCT) 320-25 MG tablet Take 1 tablet by mouth daily. 90 tablet 3  . Vilazodone HCl (VIIBRYD STARTER PACK) 10 & 20 MG KIT Take 1 tablet by mouth daily after breakfast. Use as directed, 4 weeks QS 1 kit 0   No current facility-administered medications for this visit.    No Known Allergies     Objective:  BP (!) 152/88   Pulse (!) 52   Resp 18  Wt (!) 307 lb 12.8 oz (139.6 kg)   BMI 48.21 kg/m  Gen: well-groomed, cooperative, not ill-appearing, no distress Pulm: Normal work of breathing, normal phonation, clear to auscultation bilaterally CV: Bradycardic, regular rhythm, s1 and s2 distinct, no murmurs, clicks or rubs  Neuro: alert and oriented x 3, EOM's intact, no tremor MSK: moving all extremities, normal gait and station, no peripheral edema Psych: good eye contact, normal affect, euthymic mood, normal speech and thought content    No results found for this or any previous visit (from the past 72 hour(s)). No results found.    Assessment and Plan: 46 y.o. female with   1. Hyperglycemia - noted on hyperglycemia on previous metabolic panels. Given history of PCOS and likely insulin resistance, checked a POCT Hgb A1C today. This was positive for new diagnosis of Type II DM - POCT HgB A1C  2.  Severe obesity (BMI >= 40) (HCC) - Amb Referral to Bariatric Surgery   3. PCOS (polycystic ovarian syndrome) - Ambulatory referral to Gynecology   4. Family history of breast cancer gene mutation in first degree relative - Ambulatory referral to Genetic counseling  5. Breast cancer screening by mammogram - MM DIGITAL SCREENING BILATERAL; Future  6. Type 2 diabetes mellitus with hyperglycemia, without long-term current use of insulin (HCC) - Increase Metformin to '1000mg'$  twice a day - Start Atorvastatin daily or nightly to lower cholesterol (goal LDL <70) - Start baby aspirin 81 mg daily - Follow-up with Diabetes Educator - Limit carbohydrates and simple sugars - You will need to schedule an annual diabetic eye exam with an Ophthamologist - Return in 3 months for repeat A1C - Ambulatory referral to diabetic education - metFORMIN (GLUCOPHAGE XR) 500 MG 24 hr tablet; Take 2 tablets (1,000 mg total) by mouth 2 (two) times daily with a meal.  Dispense: 120 tablet; Refill: 2 - aspirin EC 81 MG tablet; Take 1 tablet (81 mg total) by mouth daily.  Dispense: 30 tablet; Refill: 11  7. Dyslipidemia, goal LDL below 70 - atorvastatin (LIPITOR) 20 MG tablet; Take 1 tablet (20 mg total) by mouth daily.  Dispense: 90 tablet; Refill: 3  8. Uncontrolled stage 2 hypertension - still working up for secondary causes. Metanephrines normal.  9. Menorrhagia with regular cycle - referral to Gynecology   Patient education and anticipatory guidance given Patient agrees with treatment plan Follow-up in 3 months for DM II as needed if symptoms worsen or fail to improve  Darlyne Russian PA-C  I spent 40 minutes with this patient, greater than 50% was face-to-face time counseling regarding the above diagnoses

## 2017-02-03 LAB — CORTISOL, URINE, 24 HOUR
Cortisol (Ur), Free: 6.1 mcg/24 h (ref 4.0–50.0)
RESULTS RECEIVED: 0.53 g/(24.h) — ABNORMAL LOW (ref 0.63–2.50)

## 2017-02-05 ENCOUNTER — Encounter: Payer: Self-pay | Admitting: Physician Assistant

## 2017-02-07 ENCOUNTER — Ambulatory Visit (HOSPITAL_COMMUNITY): Admission: RE | Admit: 2017-02-07 | Payer: 59 | Source: Ambulatory Visit

## 2017-02-16 NOTE — Progress Notes (Signed)
So far, lab testing for secondary causes of hypertension has been negative - normal serum metanephrines  - normal urine cortisol   Unfortunately one of the labs was cancelled due to a lab error. We will need to repeat this at some point if blood pressures continue to be high.  I also recommend Gail Black schedule her renal artery ultrasound to complete the work-up

## 2017-02-17 ENCOUNTER — Telehealth: Payer: Self-pay

## 2017-02-17 NOTE — Telephone Encounter (Signed)
Called left a message for patietnt to call office to get scheduled for appt. Received referral from primary care.

## 2017-03-03 ENCOUNTER — Other Ambulatory Visit: Payer: Self-pay

## 2017-03-03 DIAGNOSIS — F4323 Adjustment disorder with mixed anxiety and depressed mood: Secondary | ICD-10-CM

## 2017-03-03 MED ORDER — ALPRAZOLAM 1 MG PO TABS: 0.5000 mg | ORAL_TABLET | Freq: Two times a day (BID) | ORAL | 0 refills | Status: DC | PRN

## 2017-03-08 ENCOUNTER — Ambulatory Visit: Payer: 59 | Admitting: Physician Assistant

## 2017-03-08 DIAGNOSIS — Z0189 Encounter for other specified special examinations: Secondary | ICD-10-CM

## 2017-03-14 ENCOUNTER — Ambulatory Visit: Payer: 59 | Admitting: Dietician

## 2017-03-15 ENCOUNTER — Other Ambulatory Visit: Payer: Self-pay | Admitting: Physician Assistant

## 2017-03-15 DIAGNOSIS — E282 Polycystic ovarian syndrome: Secondary | ICD-10-CM

## 2017-03-15 DIAGNOSIS — L680 Hirsutism: Secondary | ICD-10-CM

## 2017-03-22 ENCOUNTER — Other Ambulatory Visit: Payer: Self-pay | Admitting: Physician Assistant

## 2017-03-22 DIAGNOSIS — E1165 Type 2 diabetes mellitus with hyperglycemia: Secondary | ICD-10-CM

## 2017-03-22 DIAGNOSIS — L680 Hirsutism: Secondary | ICD-10-CM

## 2017-03-22 DIAGNOSIS — E282 Polycystic ovarian syndrome: Secondary | ICD-10-CM

## 2017-04-01 ENCOUNTER — Other Ambulatory Visit: Payer: Self-pay | Admitting: Physician Assistant

## 2017-04-01 DIAGNOSIS — I10 Essential (primary) hypertension: Secondary | ICD-10-CM

## 2017-05-09 ENCOUNTER — Other Ambulatory Visit: Payer: Self-pay | Admitting: Physician Assistant

## 2017-05-09 DIAGNOSIS — I10 Essential (primary) hypertension: Secondary | ICD-10-CM

## 2017-05-09 DIAGNOSIS — E1165 Type 2 diabetes mellitus with hyperglycemia: Secondary | ICD-10-CM

## 2017-12-23 ENCOUNTER — Other Ambulatory Visit: Payer: Self-pay | Admitting: Physician Assistant

## 2017-12-23 DIAGNOSIS — I10 Essential (primary) hypertension: Secondary | ICD-10-CM

## 2018-03-05 ENCOUNTER — Other Ambulatory Visit: Payer: Self-pay | Admitting: Physician Assistant

## 2018-03-05 DIAGNOSIS — I10 Essential (primary) hypertension: Secondary | ICD-10-CM

## 2018-03-17 ENCOUNTER — Other Ambulatory Visit: Payer: Self-pay | Admitting: Physician Assistant

## 2018-03-17 DIAGNOSIS — I10 Essential (primary) hypertension: Secondary | ICD-10-CM

## 2018-03-20 ENCOUNTER — Encounter: Payer: Self-pay | Admitting: *Deleted

## 2018-03-20 ENCOUNTER — Ambulatory Visit (HOSPITAL_BASED_OUTPATIENT_CLINIC_OR_DEPARTMENT_OTHER)
Admission: RE | Admit: 2018-03-20 | Discharge: 2018-03-20 | Disposition: A | Discharge status: Home / Self Care | Payer: 59 | Source: Ambulatory Visit | Attending: Family Medicine | Admitting: Family Medicine

## 2018-03-20 ENCOUNTER — Telehealth: Payer: Self-pay | Admitting: *Deleted

## 2018-03-20 ENCOUNTER — Emergency Department
Admission: EM | Admit: 2018-03-20 | Discharge: 2018-03-20 | Disposition: A | Discharge status: Home / Self Care | Payer: 59 | Source: Home / Self Care | Attending: Family Medicine | Admitting: Family Medicine

## 2018-03-20 ENCOUNTER — Other Ambulatory Visit: Payer: Self-pay

## 2018-03-20 DIAGNOSIS — L03116 Cellulitis of left lower limb: Secondary | ICD-10-CM

## 2018-03-20 DIAGNOSIS — Z23 Encounter for immunization: Secondary | ICD-10-CM

## 2018-03-20 DIAGNOSIS — M79662 Pain in left lower leg: Secondary | ICD-10-CM

## 2018-03-20 LAB — POCT CBC W AUTO DIFF (K'VILLE URGENT CARE)

## 2018-03-20 MED ORDER — CLINDAMYCIN HCL 300 MG PO CAPS: ORAL_CAPSULE | ORAL | 0 refills | Status: DC

## 2018-03-20 MED ORDER — HYDROCODONE-ACETAMINOPHEN 5-325 MG PO TABS: 1.0000 | ORAL_TABLET | Freq: Four times a day (QID) | ORAL | 0 refills | Status: AC | PRN

## 2018-03-20 MED ORDER — TETANUS-DIPHTH-ACELL PERTUSSIS 5-2.5-18.5 LF-MCG/0.5 IM SUSP
0.5000 mL | Freq: Once | INTRAMUSCULAR | Status: AC
  Administered 2018-03-20: 0.5 mL via INTRAMUSCULAR

## 2018-03-20 MED ORDER — VALACYCLOVIR HCL 1 G PO TABS: ORAL_TABLET | ORAL | 0 refills | Status: DC

## 2018-03-20 NOTE — ED Provider Notes (Signed)
[facility]    CSN: [id number] Arrival date & time: 03/20/18  1110     History   Chief Complaint Chief Complaint  Patient presents with  . Cellulitis    HPI Gail Black is a 47 y.o. female.   Patient complains of onset of a painful burning rash on her lateral ankle five days ago.  She recalls no injury or insect bite.  She states [place], and sat in the water for several hours, but recalls no sting or injury.  The erythema and pain have gradually increased.  She believes that she had a fever three days ago.  Two days ago she contacted a health e-service and was prescribed Keflex and Bactrim, but has seen no improvement.  Today she notes increasing pain in her left posterior calf, and has pain when walking.  She does not remember her last Tdap.  The history is provided by the patient.  Rash  Location: left lateral ankle. Quality: burning, dryness, painful, redness and swelling   Quality: not blistering, not bruising, not draining, not itchy, not peeling, not scaling and not weeping   Pain details:    Quality:  Burning   Severity:  Severe   Onset quality:  Gradual   Duration:  5 days   Timing:  Constant   Progression:  Worsening Severity:  Severe Onset quality:  Gradual Duration:  5 days Timing:  Constant Progression:  Worsening Chronicity:  New Context: not animal contact, not chemical exposure, not exposure to similar rash, not food, not hot tub use, not insect bite/sting, not medications, not new detergent/soap and not plant contact   Relieved by:  Nothing Worsened by:  Contact Ineffective treatments: antibiotics. Associated symptoms: fever and induration   Associated symptoms: no abdominal pain, no diarrhea, no fatigue, no headaches, no joint pain, no myalgias and no nausea     Past Medical History:  Diagnosis Date  . 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 History of postpartum depression   . Hypertension   .  Obesity   . PCOS (polycystic ovarian syndrome)   . Preeclampsia   . Type 2 diabetes mellitus with hyperglycemia, without long-term current use of insulin ([place]) 02/01/2017    Patient Active Problem List   Diagnosis Date Noted  . Menorrhagia with regular cycle 02/01/2017  . Dyslipidemia, goal LDL below 70 02/01/2017  . Type 2 diabetes mellitus with hyperglycemia, without long-term current use of insulin ([place]) 02/01/2017  . Family history of breast cancer gene mutation in first degree relative 02/01/2017  . Adjustment disorder with mixed anxiety and depressed mood 01/18/2017  . Hirsutism 11/22/2016  . Severe uncontrolled hypertension 11/16/2016  . Severe obesity (BMI >= 40) ([place]) 11/16/2016  . PCOS (polycystic ovarian syndrome) 11/16/2016    Past Surgical History:  Procedure Laterality Date  . CESAREAN SECTION  2013   preeclampsia  . CESAREAN SECTION WITH BILATERAL TUBAL LIGATION      OB History    Gravida  11   Para  3   Term      Preterm      AB  8   Living  3     SAB  8   TAB      Ectopic      Multiple      Live Births               Home Medications    Prior to Admission  medications   Medication Sig Start Date End Date Taking? Authorizing Provider  ALPRAZolam Duanne Moron) 1 MG tablet Take 0.5-1 tablets (0.5-1 mg total) by mouth 2 (two) times daily as needed for anxiety. 03/03/17   Trixie Dredge, PA-C  aspirin EC 81 MG tablet Take 1 tablet (81 mg total) by mouth daily. 02/01/17   Trixie Dredge, PA-C  atorvastatin (LIPITOR) 20 MG tablet Take 1 tablet (20 mg total) by mouth daily. 02/01/17   Trixie Dredge, PA-C  clindamycin (CLEOCIN) 300 MG capsule Take one cap PO every 8 hours 03/20/18   Kandra Nicolas, MD  HYDROcodone-acetaminophen (NORCO/VICODIN) 5-325 MG tablet Take 1 tablet by mouth every 6 (six) hours as needed for up to 5 days for moderate pain. 03/20/18 03/25/18  Kandra Nicolas, MD  metFORMIN (GLUCOPHAGE-XR) 500  MG 24 hr tablet TAKE 2 TABLETS BY MOUTH 2  TIMES DAILY WITH A MEAL. 05/09/17   Trixie Dredge, PA-C  spironolactone (ALDACTONE) 50 MG tablet Take 1 tablet (50 mg total) by mouth daily. 03/22/17   Trixie Dredge, PA-C  valACYclovir (VALTREX) 1000 MG tablet Take one tab PO every 8 hours 03/20/18   Kandra Nicolas, MD  valsartan-hydrochlorothiazide (DIOVAN-HCT) 320-25 MG tablet Take 1 tablet by mouth daily. 01/30/17   Trixie Dredge, PA-C  valsartan-hydrochlorothiazide (DIOVAN-HCT) 320-25 MG tablet TAKE 1 TABLET BY MOUTH DAILY. NEEDS APPT FOR REFILLS 05/09/17   Trixie Dredge, PA-C    Family History Family History  Problem Relation Age of Onset  . Breast cancer Mother   . Hypertension Father     Social History Social History   Tobacco Use  . Smoking status: Never Smoker  . Smokeless tobacco: Never Used  Substance Use Topics  . Alcohol use: Yes    Alcohol/week: 1.0 standard drinks    Types: 1 Glasses of wine per week  . Drug use: No     Allergies   Patient has no known allergies.   Review of Systems Review of Systems  Constitutional: Positive for fever. Negative for fatigue.  Gastrointestinal: Negative for abdominal pain, diarrhea and nausea.  Musculoskeletal: Negative for arthralgias and myalgias.  Skin: Positive for rash.  Neurological: Negative for headaches.  All other systems reviewed and are negative.    Physical Exam Triage Vital Signs ED Triage Vitals  Enc Vitals Group     BP 03/20/18 1145 (!) 185/131     Pulse Rate 03/20/18 1145 93     Resp 03/20/18 1145 16     Temp 03/20/18 1145 98.3 F (36.8 C)     Temp Source 03/20/18 1145 Oral     SpO2 03/20/18 1145 97 %     Weight 03/20/18 1146 299 lb (135.6 kg)     Height --      Head Circumference --      Peak Flow --      Pain Score 03/20/18 1146 7     Pain Loc --      Pain Edu? --      Excl. in [place]? --    No data found.  Updated Vital Signs BP (!) 185/131  (BP Location: Right Arm)   Pulse 93   Temp 98.3 F (36.8 C) (Oral)   Resp 16   Wt 135.6 kg   LMP 02/28/2018   SpO2 97%   BMI 46.83 kg/m   Visual Acuity Right Eye Distance:   Left Eye Distance:   Bilateral Distance:    Right Eye Near:  Left Eye Near:    Bilateral Near:     Physical Exam  Constitutional: She appears well-developed and well-nourished. No distress.  HENT:  Head: Normocephalic.  Eyes: Pupils are equal, round, and reactive to light.  Neck: Normal range of motion.  Cardiovascular: Normal rate.  Pulmonary/Chest: Effort normal.  Musculoskeletal: She exhibits tenderness. She exhibits no edema.       Left lower leg: She exhibits tenderness and swelling. She exhibits no bony tenderness and no edema.       Legs:      Feet:  Left posterior calf is tender to palpation, warm, and mildly swollen.  Homan's test positive.  Left lateral ankle above the ankle has a slightly raised, indurated, erythematous area tender to palpation.  No fluctuance.  The area appears somewhat herpetic, but no surface vesicles present.  Neurological: She is alert.  Skin: Skin is warm and dry.  Nursing note and vitals reviewed.    UC Treatments / Results  Labs (all labs ordered are listed, but only abnormal results are displayed) Labs Reviewed  POCT CBC W AUTO DIFF ([facility]):  WBC 9.1; LY 20.0; MO 3.2; GR 76.8; Hgb 13.4; Platelets 203     EKG None  Radiology US Venous Img Lower Unilateral Left  Result Date: 03/20/2018 CLINICAL DATA:  Left lower extremity pain and edema. Cellulitis. Evaluate for DVT. EXAM: LEFT LOWER EXTREMITY VENOUS DOPPLER ULTRASOUND TECHNIQUE: Gray-scale sonography with graded compression, as well as color Doppler and duplex ultrasound were performed to evaluate the lower extremity deep venous systems from the level of the common femoral vein and including the common femoral, femoral, profunda femoral, popliteal and calf veins including the posterior  tibial, peroneal and gastrocnemius veins when visible. The superficial great saphenous vein was also interrogated. Spectral Doppler was utilized to evaluate flow at rest and with distal augmentation maneuvers in the common femoral, femoral and popliteal veins. COMPARISON:  None. FINDINGS: Contralateral Common Femoral Vein: Respiratory phasicity is normal and symmetric with the symptomatic side. No evidence of thrombus. Normal compressibility. Common Femoral Vein: No evidence of thrombus. Normal compressibility, respiratory phasicity and response to augmentation. Saphenofemoral Junction: No evidence of thrombus. Normal compressibility and flow on color Doppler imaging. Profunda Femoral Vein: No evidence of thrombus. Normal compressibility and flow on color Doppler imaging. Femoral Vein: No evidence of thrombus. Normal compressibility, respiratory phasicity and response to augmentation. Popliteal Vein: No evidence of thrombus. Normal compressibility, respiratory phasicity and response to augmentation. Calf Veins: No evidence of thrombus. Normal compressibility and flow on color Doppler imaging. Superficial Great Saphenous Vein: No evidence of thrombus. Normal compressibility. Venous Reflux:  None. Other Findings: There is a moderate amount of subcutaneous edema involving the left lateral lower leg. IMPRESSION: No evidence of DVT within the left lower extremity. Electronically Signed   By: Sandi Mariscal M.D.   On: 03/20/2018 16:20    Procedures Procedures (including critical care time)  Medications Ordered in UC Medications  Tdap (BOOSTRIX) injection 0.5 mL (0.5 mLs Intramuscular Given 03/20/18 1233)    Initial Impression / Assessment and Plan / UC Course  I have reviewed the triage vital signs and the nursing notes.  Pertinent labs & imaging results that were available during my care of the patient were reviewed by me and considered in my medical decision making (see chart for details).    Normal WBC  reassuring.  No evidence DVT. Begin clindamycin 300mg  TID.   ?herpes zoster.  Begin empiric Valtrex. With her recent exposure  to ocean water, other possibilities include "swiimer's itch" (cercarial dermatitis) and "seabather's eruption" (caused by jellyfish larvae). Rx for Lortab. Controlled Substance Prescriptions I have consulted the [place] Controlled Substances Registry for this patient, and feel the risk/benefit ratio today is favorable for proceeding with this prescription for a controlled substance.    Followup with dermatologist if not improved about 3 days.   Final Clinical Impressions(s) / UC Diagnoses   Final diagnoses:  Cellulitis of left lower leg  Pain of left calf   Discharge Instructions   None    ED Prescriptions    Medication Sig Dispense Auth. Provider   clindamycin (CLEOCIN) 300 MG capsule Take one cap PO every 8 hours 21 capsule Kandra Nicolas, MD   valACYclovir (VALTREX) 1000 MG tablet Take one tab PO every 8 hours 21 tablet Kandra Nicolas, MD   HYDROcodone-acetaminophen (NORCO/VICODIN) 5-325 MG tablet Take 1 tablet by mouth every 6 (six) hours as needed for up to 5 days for moderate pain. 15 tablet Kandra Nicolas, MD         Kandra Nicolas, MD 03/20/18 2130

## 2018-03-20 NOTE — ED Triage Notes (Signed)
Patient c/o pain and infection in her left ankle x 4 days. She was prescribed Cephalexin and bactrim via tele-doc 2 days ago. Site is still very red and sore and stomach is irritated from antibiotics.

## 2018-03-20 NOTE — Discharge Instructions (Signed)
Continue to elevate leg.  May take Ibuprofen 200mg , 4 tabs every 8 hours with food.  Stop Keflex and Bactrim.

## 2018-03-20 NOTE — Telephone Encounter (Signed)
Patient advised of US results. 

## 2018-03-22 ENCOUNTER — Emergency Department (INDEPENDENT_AMBULATORY_CARE_PROVIDER_SITE_OTHER): Payer: 59

## 2018-03-22 ENCOUNTER — Other Ambulatory Visit: Payer: Self-pay

## 2018-03-22 ENCOUNTER — Emergency Department
Admission: EM | Admit: 2018-03-22 | Discharge: 2018-03-22 | Disposition: A | Discharge status: Home / Self Care | Payer: 59 | Source: Home / Self Care | Attending: Family Medicine | Admitting: Family Medicine

## 2018-03-22 DIAGNOSIS — L03116 Cellulitis of left lower limb: Secondary | ICD-10-CM | POA: Diagnosis not present

## 2018-03-22 DIAGNOSIS — M7989 Other specified soft tissue disorders: Secondary | ICD-10-CM | POA: Diagnosis not present

## 2018-03-22 DIAGNOSIS — S80822A Blister (nonthermal), left lower leg, initial encounter: Secondary | ICD-10-CM

## 2018-03-22 DIAGNOSIS — Z5189 Encounter for other specified aftercare: Secondary | ICD-10-CM

## 2018-03-22 DIAGNOSIS — R030 Elevated blood-pressure reading, without diagnosis of hypertension: Secondary | ICD-10-CM

## 2018-03-22 NOTE — Discharge Instructions (Signed)
°  You should change the bandage 2-3 times daily, more often if needed depending on the drainage from the wound.  Try to keep your leg elevated above your heart as much as possible to help with swelling and pain. Continue to take the antibiotics as prescribed.  You may take over the counter probiotics to help with stomach upset.  Please follow up with family medicine in 3-4 days for a wound recheck, or return to urgent care if needed if concerned symptoms are not improving anymore.  Please continue to take your blood pressure medication as prescribed.

## 2018-03-22 NOTE — Telephone Encounter (Signed)
Callback: No answer. LMOM advising patient to return call to our office. She has an appointment on clockwise to come in today. If she is getting worse she may need further treatment at a hospital.

## 2018-03-22 NOTE — ED Triage Notes (Signed)
Patient was seen 2 days ago for infection to her left ankle. Later that night she went to the ER for worsening pain, nausea and fever. Given vancomycin. She reports the redness is better and she feels better but awoke with a blister at the site this AM.

## 2018-03-22 NOTE — ED Provider Notes (Signed)
[facility]    CSN: [id number] Arrival date & time: 03/22/18  1223     History   Chief Complaint Chief Complaint  Patient presents with  . Cellulitis  . Blister    HPI Gail Black is a 47 y.o. female.   HPI  Gail Black is a 47 y.o. female presenting to UC with c/o worsening appearing wound on Left lower leg.  She was seen at [facility] on 03/20/18 and dx with cellulitis of her Left lower leg.  She was started on clindamycin that day but went to the hospital that evening due to worsening pain. She was given IV vancomycin in the emergency department and advised to resume the clindamycin and bactrim the next day.  She feels better overall. No fever, chills nausea or vomiting. She has had diarrhea, which she contributes to the antibiotics.  She is most concerned about a blister that developed on her Left lower lateral ankle this morning.    BP elevated in triage. Pt has hx of HTN and had been out of her BP medication for several days.  She took her medication just PTA.  Denies HA, dizziness, chest pain or SOB.    Past Medical History:  Diagnosis Date  . 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 History of postpartum depression   . Hypertension   . Obesity   . PCOS (polycystic ovarian syndrome)   . Preeclampsia   . Type 2 diabetes mellitus with hyperglycemia, without long-term current use of insulin ([place]) 02/01/2017    Patient Active Problem List   Diagnosis Date Noted  . Menorrhagia with regular cycle 02/01/2017  . Dyslipidemia, goal LDL below 70 02/01/2017  . Type 2 diabetes mellitus with hyperglycemia, without long-term current use of insulin ([place]) 02/01/2017  . Family history of breast cancer gene mutation in first degree relative 02/01/2017  . Adjustment disorder with mixed anxiety and depressed mood 01/18/2017  . Hirsutism 11/22/2016  . Severe uncontrolled hypertension 11/16/2016  . Severe obesity (BMI >= 40) ([place]) 11/16/2016  . PCOS (polycystic ovarian  syndrome) 11/16/2016    Past Surgical History:  Procedure Laterality Date  . CESAREAN SECTION  2013   preeclampsia  . CESAREAN SECTION WITH BILATERAL TUBAL LIGATION      OB History    Gravida  11   Para  3   Term      Preterm      AB  8   Living  3     SAB  8   TAB      Ectopic      Multiple      Live Births               Home Medications    Prior to Admission medications   Medication Sig Start Date End Date Taking? Authorizing Provider  ALPRAZolam Duanne Moron) 1 MG tablet Take 0.5-1 tablets (0.5-1 mg total) by mouth 2 (two) times daily as needed for anxiety. 03/03/17   Trixie Dredge, PA-C  aspirin EC 81 MG tablet Take 1 tablet (81 mg total) by mouth daily. 02/01/17   Trixie Dredge, PA-C  atorvastatin (LIPITOR) 20 MG tablet Take 1 tablet (20 mg total) by mouth daily. 02/01/17   Trixie Dredge, PA-C  clindamycin (CLEOCIN) 300 MG capsule Take one cap PO every 8 hours 03/20/18   Kandra Nicolas, MD  HYDROcodone-acetaminophen (NORCO/VICODIN) 5-325 MG tablet Take 1 tablet by mouth every 6 (six) hours as needed  for up to 5 days for moderate pain. 03/20/18 03/25/18  Kandra Nicolas, MD  metFORMIN (GLUCOPHAGE-XR) 500 MG 24 hr tablet TAKE 2 TABLETS BY MOUTH 2  TIMES DAILY WITH A MEAL. 05/09/17   Trixie Dredge, PA-C  spironolactone (ALDACTONE) 50 MG tablet Take 1 tablet (50 mg total) by mouth daily. 03/22/17   Trixie Dredge, PA-C  valACYclovir (VALTREX) 1000 MG tablet Take one tab PO every 8 hours 03/20/18   Kandra Nicolas, MD  valsartan-hydrochlorothiazide (DIOVAN-HCT) 320-25 MG tablet Take 1 tablet by mouth daily. 01/30/17   Trixie Dredge, PA-C  valsartan-hydrochlorothiazide (DIOVAN-HCT) 320-25 MG tablet TAKE 1 TABLET BY MOUTH DAILY. NEEDS APPT FOR REFILLS 05/09/17   Trixie Dredge, PA-C    Family History Family History  Problem Relation Age of Onset  . Breast cancer Mother   .  Hypertension Father     Social History Social History   Tobacco Use  . Smoking status: Never Smoker  . Smokeless tobacco: Never Used  Substance Use Topics  . Alcohol use: Yes    Alcohol/week: 1.0 standard drinks    Types: 1 Glasses of wine per week  . Drug use: No     Allergies   Patient has no known allergies.   Review of Systems Review of Systems  Constitutional: Negative for chills and fever.  Gastrointestinal: Positive for diarrhea. Negative for abdominal pain, nausea and vomiting.  Musculoskeletal: Positive for arthralgias, joint swelling and myalgias.  Skin: Positive for color change and wound.  Neurological: Negative for weakness and numbness.     Physical Exam Triage Vital Signs ED Triage Vitals [03/22/18 1238]  Enc Vitals Group     BP (!) 172/114     Pulse Rate 80     Resp 16     Temp (!) 97.5 F (36.4 C)     Temp Source Oral     SpO2 97 %     Weight      Height      Head Circumference      Peak Flow      Pain Score 4     Pain Loc      Pain Edu?      Excl. in [place]?    No data found.  Updated Vital Signs BP (!) 172/114 (BP Location: Left Arm)   Pulse 80   Temp (!) 97.5 F (36.4 C) (Oral)   Resp 16   LMP 02/28/2018   SpO2 97%   Visual Acuity Right Eye Distance:   Left Eye Distance:   Bilateral Distance:    Right Eye Near:   Left Eye Near:    Bilateral Near:     Physical Exam  Constitutional: She is oriented to person, place, and time. She appears well-developed and well-nourished.  HENT:  Head: Normocephalic and atraumatic.  Eyes: EOM are normal.  Neck: Normal range of motion.  Cardiovascular: Normal rate.  Pulmonary/Chest: Effort normal.  Musculoskeletal: Normal range of motion. She exhibits edema and tenderness.       Legs: Left lower leg: 2+ pitting edema. Tenderness to anteriolateral leg. (see skin exam)  Neurological: She is alert and oriented to person, place, and time.  Skin: Skin is warm and dry. Capillary refill takes  less than 2 seconds. There is erythema.  Left lower leg: 4x5cm area of erythema and induration, centralized 2cm bullae draining scant amount of clear-yellow discharge. Area is tender to touch. No fluctuance under (after blister drained) or around the bullae  Psychiatric: She has a normal mood and affect. Her behavior is normal.  Nursing note and vitals reviewed.    UC Treatments / Results  Labs (all labs ordered are listed, but only abnormal results are displayed) Labs Reviewed - No data to display  EKG None  Radiology Dg Ankle Complete Left  Result Date: 03/22/2018 CLINICAL DATA:  Skin infection lateral left ankle for 1 week EXAM: LEFT ANKLE COMPLETE - 3+ VIEW COMPARISON:  None. FINDINGS: Diffuse soft tissue swelling. No soft tissue gas or radiopaque foreign body. No acute bony abnormality. Specifically, no fracture, subluxation, or dislocation. Plantar calcaneal spur. No radiographic changes of osteomyelitis. IMPRESSION: Diffuse soft tissue swelling.  No acute bony abnormality. Electronically Signed   By: Rolm Baptise M.D.   On: 03/22/2018 13:08   US Venous Img Lower Unilateral Left  Result Date: 03/20/2018 CLINICAL DATA:  Left lower extremity pain and edema. Cellulitis. Evaluate for DVT. EXAM: LEFT LOWER EXTREMITY VENOUS DOPPLER ULTRASOUND TECHNIQUE: Gray-scale sonography with graded compression, as well as color Doppler and duplex ultrasound were performed to evaluate the lower extremity deep venous systems from the level of the common femoral vein and including the common femoral, femoral, profunda femoral, popliteal and calf veins including the posterior tibial, peroneal and gastrocnemius veins when visible. The superficial great saphenous vein was also interrogated. Spectral Doppler was utilized to evaluate flow at rest and with distal augmentation maneuvers in the common femoral, femoral and popliteal veins. COMPARISON:  None. FINDINGS: Contralateral Common Femoral Vein: Respiratory  phasicity is normal and symmetric with the symptomatic side. No evidence of thrombus. Normal compressibility. Common Femoral Vein: No evidence of thrombus. Normal compressibility, respiratory phasicity and response to augmentation. Saphenofemoral Junction: No evidence of thrombus. Normal compressibility and flow on color Doppler imaging. Profunda Femoral Vein: No evidence of thrombus. Normal compressibility and flow on color Doppler imaging. Femoral Vein: No evidence of thrombus. Normal compressibility, respiratory phasicity and response to augmentation. Popliteal Vein: No evidence of thrombus. Normal compressibility, respiratory phasicity and response to augmentation. Calf Veins: No evidence of thrombus. Normal compressibility and flow on color Doppler imaging. Superficial Great Saphenous Vein: No evidence of thrombus. Normal compressibility. Venous Reflux:  None. Other Findings: There is a moderate amount of subcutaneous edema involving the left lateral lower leg. IMPRESSION: No evidence of DVT within the left lower extremity. Electronically Signed   By: Sandi Mariscal M.D.   On: 03/20/2018 16:20    Procedures Incision and Drainage Date/Time: 03/22/2018 1:43 PM Performed by: Noe Gens, PA-C Authorized by: Kandra Nicolas, MD   Consent:    Consent obtained:  Verbal   Risks discussed:  Incomplete drainage and pain Location:    Type:  [place]   Size:  2   Location:  Lower extremity   Lower extremity location:  Leg   Leg location:  L lower leg Anesthesia (see MAR for exact dosages):    Anesthesia method:  None Procedure type:    Complexity:  Simple Procedure details:    Incision types:  Stab incision (18gtt needle)   Incision depth:  Subcutaneous   Drainage:  Serosanguinous   Drainage amount:  Scant   Wound treatment:  Wound left open   Packing materials:  None Post-procedure details:    Patient tolerance of procedure:  Tolerated well, no immediate complications   (including critical  care time)  Medications Ordered in UC Medications - No data to display  Initial Impression / Assessment and Plan / UC Course  I have  reviewed the triage vital signs and the nursing notes.  Pertinent labs & imaging results that were available during my care of the patient were reviewed by me and considered in my medical decision making (see chart for details).     Medical chart reviewed.  Imaging performed to ensure no osteomyelitis.  Discussed imaging with pt. Wound cleaned with warm water and Hibiclens.  Bandage applied. Home care instructions provided below.  Final Clinical Impressions(s) / UC Diagnoses   Final diagnoses:  Cellulitis of left lower leg  Visit for wound check  Elevated blood pressure reading     Discharge Instructions      You should change the bandage 2-3 times daily, more often if needed depending on the drainage from the wound.  Try to keep your leg elevated above your heart as much as possible to help with swelling and pain. Continue to take the antibiotics as prescribed.  You may take over the counter probiotics to help with stomach upset.  Please follow up with family medicine in 3-4 days for a wound recheck, or return to urgent care if needed if concerned symptoms are not improving anymore.  Please continue to take your blood pressure medication as prescribed.     ED Prescriptions    None     Controlled Substance Prescriptions [place] Controlled Substance Registry consulted? Not Applicable   Tyrell Antonio 03/22/18 1345

## 2018-03-24 ENCOUNTER — Telehealth: Payer: Self-pay

## 2018-03-24 NOTE — Telephone Encounter (Signed)
Pt says her sxs are the same. She is trying to figure out if she needs a recheck. She has been on her antibiotics and pain meds around the clock, but starts to get a fever if she does not. Advised to pt either f/u here or at ER with it not improving.

## 2018-03-26 ENCOUNTER — Telehealth: Payer: Self-pay

## 2018-03-26 NOTE — Telephone Encounter (Signed)
Pt stated that she was on her feet all day at work, and leg wound is oozing, red and swollen.  Spoke with Deforest Hoyles and she stated that her and Dr Assunta Found discussed the case, and at this point, pt needs to go to Wound Care.  Information given to patient.

## 2018-04-01 ENCOUNTER — Other Ambulatory Visit: Payer: Self-pay | Admitting: Physician Assistant

## 2018-04-01 DIAGNOSIS — I10 Essential (primary) hypertension: Secondary | ICD-10-CM

## 2018-04-08 ENCOUNTER — Other Ambulatory Visit: Payer: Self-pay | Admitting: Physician Assistant

## 2018-04-08 DIAGNOSIS — I10 Essential (primary) hypertension: Secondary | ICD-10-CM

## 2018-11-29 ENCOUNTER — Telehealth: Payer: Self-pay | Admitting: Physician Assistant

## 2018-11-29 NOTE — Telephone Encounter (Signed)
Please contact patient to schedule diabetes/hypertension follow-up

## 2018-11-29 NOTE — Telephone Encounter (Signed)
Pt stated that she is not able to come in at this time for a f/u.  She said she will call back later to make appointment. -EH/RMA

## 2018-12-13 DIAGNOSIS — I63313 Cerebral infarction due to thrombosis of bilateral middle cerebral arteries: Secondary | ICD-10-CM | POA: Insufficient documentation

## 2018-12-17 DIAGNOSIS — L03114 Cellulitis of left upper limb: Secondary | ICD-10-CM | POA: Insufficient documentation

## 2018-12-17 DIAGNOSIS — E559 Vitamin D deficiency, unspecified: Secondary | ICD-10-CM | POA: Insufficient documentation

## 2018-12-18 MED ORDER — Medication
40.00 | Status: DC
Start: 2018-12-17 — End: 2018-12-18

## 2018-12-18 MED ORDER — DOLACET PO
0.10 | ORAL | Status: DC
Start: 2018-12-17 — End: 2018-12-18

## 2018-12-18 MED ORDER — EQUATE NICOTINE 4 MG MT GUM
4.00 | CHEWING_GUM | OROMUCOSAL | Status: DC
Start: ? — End: 2018-12-18

## 2018-12-18 MED ORDER — ASPIRIN 325 MG PO TABS
325.00 | ORAL_TABLET | ORAL | Status: DC
Start: 2018-12-18 — End: 2018-12-18

## 2018-12-18 MED ORDER — INSULIN LISPRO 100 UNIT/ML ~~LOC~~ SOLN
2.00 | SUBCUTANEOUS | Status: DC
Start: 2018-12-18 — End: 2018-12-18

## 2018-12-18 MED ORDER — Medication
Status: DC
Start: ? — End: 2018-12-18

## 2018-12-18 MED ORDER — LOSARTAN POTASSIUM 100 MG PO TABS
100.00 | ORAL_TABLET | ORAL | Status: DC
Start: 2018-12-18 — End: 2018-12-18

## 2018-12-18 MED ORDER — VITAMIN D3 25 MCG (1000 UNIT) PO TABS
1000.00 | ORAL_TABLET | ORAL | Status: DC
Start: 2018-12-18 — End: 2018-12-18

## 2018-12-18 MED ORDER — GLUCOSAMINE-CHONDROIT-COLLAGEN PO
100.00 | ORAL | Status: DC
Start: 2018-12-17 — End: 2018-12-18

## 2018-12-18 MED ORDER — ONE-A-DAY WITHIN PO
30.00 | ORAL | Status: DC
Start: ? — End: 2018-12-18

## 2018-12-18 MED ORDER — CALCIUM-VITAMIN A-VITAMIN D PO
30.00 | ORAL | Status: DC
Start: 2018-12-18 — End: 2018-12-18

## 2018-12-18 MED ORDER — Medication
10.00 | Status: DC
Start: 2018-12-18 — End: 2018-12-18

## 2018-12-18 MED ORDER — ETANERCEPT 50 MG/ML ~~LOC~~ SOLN [COMPILED RECORD] [AGE ADULT]
1000.00 | SUBCUTANEOUS | Status: DC
Start: ? — End: 2018-12-18

## 2018-12-18 MED ORDER — BARO-CAT PO
10.00 | ORAL | Status: DC
Start: ? — End: 2018-12-18

## 2018-12-18 MED ORDER — PAIN PM EXTRA STRENGTH 500-25 MG PO TABS
1.00 | ORAL_TABLET | ORAL | Status: DC
Start: ? — End: 2018-12-18

## 2018-12-18 MED ORDER — CVS EAR DROPS OT
40.00 | OTIC | Status: DC
Start: 2018-12-17 — End: 2018-12-18

## 2018-12-18 MED ORDER — POLY-VITAMIN/FLUORIDE/IRON 1-12 MG PO CHEW
1.00 | CHEWABLE_TABLET | ORAL | Status: DC
Start: 2018-12-17 — End: 2018-12-18

## 2018-12-18 MED ORDER — CHLOROPHYLL EX
10.00 | CUTANEOUS | Status: DC
Start: ? — End: 2018-12-18

## 2018-12-18 MED ORDER — QUINERVA 260 MG PO TABS
650.00 | ORAL_TABLET | ORAL | Status: DC
Start: ? — End: 2018-12-18

## 2018-12-18 MED ORDER — RA COLD/COUGH DM 15-1-5 MG/5ML PO ELIX
80.00 | ORAL_SOLUTION | ORAL | Status: DC
Start: 2018-12-17 — End: 2018-12-18

## 2018-12-18 MED ORDER — QC CLOTRIMAZOLE 1 % EX CREA
2.00 | TOPICAL_CREAM | CUTANEOUS | Status: DC
Start: 2018-12-17 — End: 2018-12-18

## 2018-12-18 MED ORDER — ANKLE SUPPORT/FIGURE-8 MEDIUM MISC
50000.00 | Status: DC
Start: 2018-12-22 — End: 2018-12-18

## 2018-12-25 ENCOUNTER — Ambulatory Visit: Payer: 59 | Admitting: Physician Assistant

## 2018-12-25 VITALS — BP 158/77 | HR 64 | Temp 98.2°F | Resp 14 | Wt 265.0 lb

## 2018-12-25 DIAGNOSIS — E1159 Type 2 diabetes mellitus with other circulatory complications: Secondary | ICD-10-CM | POA: Diagnosis not present

## 2018-12-25 DIAGNOSIS — Z09 Encounter for follow-up examination after completed treatment for conditions other than malignant neoplasm: Secondary | ICD-10-CM | POA: Diagnosis not present

## 2018-12-25 DIAGNOSIS — F39 Unspecified mood [affective] disorder: Secondary | ICD-10-CM

## 2018-12-25 DIAGNOSIS — F419 Anxiety disorder, unspecified: Secondary | ICD-10-CM

## 2018-12-25 DIAGNOSIS — I152 Hypertension secondary to endocrine disorders: Secondary | ICD-10-CM

## 2018-12-25 DIAGNOSIS — Z9189 Other specified personal risk factors, not elsewhere classified: Secondary | ICD-10-CM

## 2018-12-25 DIAGNOSIS — I110 Hypertensive heart disease with heart failure: Secondary | ICD-10-CM

## 2018-12-25 DIAGNOSIS — N920 Excessive and frequent menstruation with regular cycle: Secondary | ICD-10-CM

## 2018-12-25 DIAGNOSIS — E11649 Type 2 diabetes mellitus with hypoglycemia without coma: Secondary | ICD-10-CM

## 2018-12-25 DIAGNOSIS — I43 Cardiomyopathy in diseases classified elsewhere: Secondary | ICD-10-CM

## 2018-12-25 DIAGNOSIS — I10 Essential (primary) hypertension: Secondary | ICD-10-CM

## 2018-12-25 DIAGNOSIS — I63313 Cerebral infarction due to thrombosis of bilateral middle cerebral arteries: Secondary | ICD-10-CM

## 2018-12-25 DIAGNOSIS — E282 Polycystic ovarian syndrome: Secondary | ICD-10-CM

## 2018-12-25 MED ORDER — VALSARTAN 160 MG PO TABS: 160.0000 mg | ORAL_TABLET | Freq: Two times a day (BID) | ORAL | 0 refills | Status: DC

## 2018-12-25 NOTE — Progress Notes (Signed)
HPI:                                                                Gail Black is a 48 y.o. female who presents to [place]: [facility] today for hospital follow-up  Gail Black is a very pleasant 48 year old female with past medical history of uncontrolled hypertension, preeclampsia, PCOS, type 2 diabetes, and recent diagnosis of acute CVA with right-sided residual weakness and new onset cardiomyopathy with mildly reduced EF 45-50%.  On May 6 she developed sudden onset right-sided weakness and presented to the [facility] health emergency room where she was diagnosed with stroke of the left thalamic, right basal ganglia and right periventricular white matter. Echocardiogram also showed moderate left ventricular hypertrophy with a mild reduced EF of 45-50%. She was started on Lipitor 80 mg and ASA 325 mg for CVA and Amlodipine, Clonidine, Losartan and Metoprolol. Diabetes was also found to be uncontrolled and she was started on Levemir 25 U nightly and Pioglitazone. She was also found to have severe proteinuria and was started on Losartan 100 mg daily. Lastly she completed 7day course of Bactrim for cellulitis of her IV site. Denies redness, pain, swelling or drainage.  She was also evaluated by psychiatry and started on Paxil 20 mg and Trazodone 50 mg.  She was discharged to a SNF for 1 week and has been home for the past week. 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  She reports her right-sided weakness is gradually improving with PT/OT. She is ambulating without assistance.   She has been monitoring her BP and glucose at home. She states BP has been elevated at home with SBP in the 150's-160's. She states Clonidine was d/c'd at the SNF due to nausea and hypotension. She has had 3 episodes of hypoglycemia, glucose readings of 54, 61 and 70. Two episodes occurred while she was sleeping. She reports husband woke her up because she was  sweating. She corrected with orange juice. She has been compliant with her insulin and is following a 1200 calorie diet.  She reports prior to her hospitalization she was "manic" for 5 days and states she did not sleep and was obsessively cleaning her house. She has not had any additional mania symptoms and is pleased with her current medications.  She also reports that her LMP on 5/6 was extremely heavy with bleeding for 7 days. She is interested in treatment for this.  Past Medical History:  Diagnosis Date  . 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 History of postpartum depression   . Hypertension   . Obesity   . PCOS (polycystic ovarian syndrome)   . Preeclampsia   . Type 2 diabetes mellitus with hyperglycemia, without long-term current use of insulin ([place]) 02/01/2017   Past Surgical History:  Procedure Laterality Date  . CESAREAN SECTION  2013   preeclampsia  . CESAREAN SECTION WITH BILATERAL TUBAL LIGATION     Social History   Tobacco Use  . Smoking status: Never Smoker  . Smokeless tobacco: Never Used  Substance Use Topics  . Alcohol use: Yes    Alcohol/week: 1.0 standard drinks    Types: 1 Glasses of wine per week   family history includes Breast cancer  in her mother; Hypertension in her father.    Review of Systems  Eyes:       + blurred vision   Respiratory: Negative for cough and shortness of breath.   Cardiovascular: Negative for chest pain.  Genitourinary: Positive for menstrual problem.  Musculoskeletal: Positive for gait problem.  Neurological: Positive for weakness. Negative for speech difficulty.  Psychiatric/Behavioral: The patient is nervous/anxious.   All other systems reviewed and are negative.    Medications: Current Outpatient Medications  Medication Sig Dispense Refill  . aspirin 325 MG tablet Take by mouth.    Marland Kitchen atorvastatin (LIPITOR) 80 MG tablet Take 80 mg by mouth daily.    . cholecalciferol (VITAMIN D) 25 MCG (1000 UT) tablet Take  by mouth.    . Insulin Detemir (LEVEMIR) 100 UNIT/ML Pen Inject 23 Units into the skin daily after breakfast.    . Insulin Pen Needle 32G X 4 MM MISC Use as directed with insulin pen    . vitamin B-12 (CYANOCOBALAMIN) 500 MCG tablet Take by mouth.    . ALPRAZolam (XANAX) 1 MG tablet Take 0.5-1 tablets (0.5-1 mg total) by mouth 2 (two) times daily as needed for anxiety. 20 tablet 0  . amLODipine (NORVASC) 10 MG tablet     . Continuous Blood Gluc Receiver (FREESTYLE LIBRE 14 DAY READER) DEVI 1 Device by Does not apply route 4 (four) times daily as needed. 1 Device 0  . Continuous Blood Gluc Sensor (FREESTYLE LIBRE 14 DAY SENSOR) MISC 1 Device by Does not apply route 4 (four) times daily as needed. 2 each 11  . LORazepam (ATIVAN) 0.5 MG tablet     . metoprolol tartrate (LOPRESSOR) 25 MG tablet Take 12.5 mg by mouth 2 (two) times a day.    Marland Kitchen PARoxetine (PAXIL) 20 MG tablet     . traZODone (DESYREL) 50 MG tablet     . valACYclovir (VALTREX) 1000 MG tablet Take one tab PO every 8 hours 21 tablet 0  . valsartan (DIOVAN) 160 MG tablet Take 1 tablet (160 mg total) by mouth 2 (two) times daily. 180 tablet 0  . Vitamin D, Ergocalciferol, (DRISDOL) 1.25 MG (50000 UT) CAPS capsule      No current facility-administered medications for this visit.    Allergies  Allergen Reactions  . Clonidine Derivatives Nausea Only and Other (See Comments)    hypotension  . Metformin And Related Nausea And Vomiting    Severe GI upset, diarrhea       Objective:  BP (!) 158/77 (BP Location: Right Arm)   Pulse 64   Temp 98.2 F (36.8 C)   Resp 14   Wt 265 lb (120.2 kg)   LMP 12/12/2018   SpO2 98%   BMI 41.50 kg/m  Gen:  alert, not ill-appearing, no distress, appropriate for age 29: head normocephalic without obvious abnormality, conjunctiva and cornea clear, trachea midline Pulm: Normal work of breathing, normal phonation, clear to auscultation bilaterally, no wheezes, rales or rhonchi CV: Normal rate,  regular rhythm, s1 and s2 distinct, no murmurs, clicks or rubs  Neuro: alert and oriented x 3, no tremor MSK: extremities atraumatic, normal gait and station, 2+ peripheral edema bilaterally Skin: intact, no rashes on exposed skin, no jaundice, no cyanosis Psych: well-groomed, cooperative, good eye contact, euthymic mood, affect mood-congruent, speech is articulate, and thought processes clear and goal-directed  Labs Care Everywhere  Other Result Information  Acute Interface, Incoming Card Results - 12/13/2018  4:31 PM EDT  [facility]                                             [address]  +-----------------+ +------------------------------------+     Gail Black:                 : :                                    Rondall Allegra,:                 : :                                    :       [place] [id number]   :                 : :                                    :    [phone]:                 : :                                    :    Fax [phone]                 : +------------------------------------+         [id number]     :                 : +-----------------+                                           Echocardiogram                                               Report +------------------------------------------------------------------------------ -+ :Name: Gail Black                      Study Date: 12/13/2018 01:15 P M: :Patient Location: [place] [id number]: 170/106 mmHg                : :MRN: [id number]                                                               : :  DOB: 1971/03/17                                 Height: 67 in               : :Gender: Female                                  Weight: 292 lb              : :Race: WHITE,White or Caucasian                                              : :Age: 82 yrs                                      BSA: 2.4 m2                 : :Reason For Study: ^Stroke                                                     : :History: Dyslipidemia, HTN, DM, Stroke.                                     : :Referring Physician: Nelson Chimes                                       : :Performed By: Nyra Jabs, RDCS                                 : +------------------------------------------------------------------------------ -+  Interpretation Summary A complete portable two-dimensional transthoracic echocardiogram with color flow Doppler and Spectral Doppler was performed. Saline contrast injection was performed. A three-dimenisonal acquisition was performed. The study was technically difficult. There is no comparison study available. Injection of contrast documented no interatrial shunt . The left ventricle is normal in size. There is moderate concentric left ventricular hypertrophy. The left ventricular ejection fraction is mildly reduced (45-50%). There is mild aortic root dilatation. Estimation of right ventricular systolic pressure is not possible. The aortic valve is not well visualized, but is grossly normal. Grade I mild diastolic dysfunction; abnormal relaxation pattern. There is mild diffuse hypokinesis of the left ventricle.  Left Ventricle The left ventricle is normal in size. There is moderate concentric left ventricular hypertrophy. The left ventricular ejection fraction is mildly reduced (45-50%). There is mild diffuse hypokinesis of the left ventricle. Grade I mild diastolic dysfunction; abnormal relaxation pattern. The E/e' ratio is abnormal.   Right Ventricle The right ventricle is grossly normal in size and function.  Atria Injection of contrast documented no interatrial shunt . The left atrium is normal size. The right atrium is normal. The IVC is normal in size.  Mitral Valve The mitral valve is mildly thickened. There is mild mitral  annular calcification. There is trace mitral regurgitation.   Tricuspid Valve There is trace tricuspid regurgitation. Estimation of right ventricular systolic pressure is not possible.  Aortic Valve The aortic valve is not well visualized, but is grossly normal. There is no aortic regurgitation present.  Pulmonic Valve The pulmonic valve is not well visualized.  Vessels There is mild aortic root dilatation.  Pericardium There is no pericardial effusion.  MMode/2D Measurements & Calculations IVSd: 1.7 cm                        LVIDd: 5.1 cm                                     LVIDs: 4.0 cm                                     LVPWd: 1.6 cm          _____________________________________________________________ LV mass(C)d: 381.7 grams            Ao root diam: 4.0 cm  LV mass(C)dI: 160.8 grams/m2        Ao root area: 12.5 cm2                                     LA dimension: 3.5 cm         _____________________________________________________________  EDV(sp4-el): 120.5 ml               SV(MOD-sp4): 54.1 ml LVAs ap4: 23.1 cm2 LVLs ap4: 6.8 cm ESV(MOD-sp4): 61.4 ml ESV(sp4-el): 67.2 ml         _____________________________________________________________  SV(sp4-el): 53.3 ml                 LAV (MOD-bp) Index: 11.0 ml/m2          _____________________________________________________________ LAV(MOD-bp): 26.1 ml  Doppler Measurements & Calculations MV E max vel: 61.6 cm/sec MV A max vel: 76.2 cm/sec           MV dec slope: 261.0 cm/sec2 MV E/A: 0.81                        MV dec time: 0.24 sec          _____________________________________________________________ Ao V2 max: 181.0 cm/sec             TR max vel: 232.0 cm/sec Ao max PG: 13.1 mmHg                TR max PG: 21.5 mmHg   ____________________________________________________________________________  Electronically signed [id number] [place]   on   12/13/2018 04:27 PM Ordering Physician:  [id number]^^^      Assessment and Plan: 48 y.o. female with   .Diagnoses and all orders for this visit:  Hypertensive cardiomyopathy, with heart failure (HCC) -     valsartan (DIOVAN) 160 MG tablet; Take 1 tablet (160 mg total) by mouth 2 (two) times daily. -     Ambulatory referral to Cardiology  Hypertension associated with diabetes ([place])  Uncontrolled type 2 diabetes mellitus with hypoglycemia without coma ([place]) -     Continuous Blood Gluc Receiver (FREESTYLE LIBRE  14 DAY READER) DEVI; 1 Device by Does not apply route 4 (four) times daily as needed. -     Continuous Blood Gluc Sensor (FREESTYLE LIBRE 14 DAY SENSOR) MISC; 1 Device by Does not apply route 4 (four) times daily as needed.  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 Home BP readings not at goal D/C Losartan and switch to Valsartan 160 mg bid Cont Amlodipine 10 mg and Metoprolol 12.5 mg bid  CVA Cont ASA 325 mg and high intensity statin therapy Home sleep study ordered +STOPBANG - BMI>35, HTN, snoring  Type 2 DM with hypoglycemia Reduce Levemir to 23U daily Stop Pioglitazone due to cardiomyopathy - monitor morning fasting sugar daily and 2 hours after meals - check blood sugar whenever you don't feel well (nausea, sweating dizziness, increased urination) - limit carbs to 30g per meal and 15g per snack Keep f/u with Endocrinology  Cardiomyopathy Moderate LVH with EF 45-50% Not a candidate for Entresto D/C Losartan and switch to  Valsartan 160 mg bid Cont Amlodipine 10 mg and Metoprolol 12.5 mg bid 2+ peripheral edema may be a side effect of Amlodipine or Pioglitazone. No dyspnea, orthopnea, PND Referral placed to Cardiology  Patient education and anticipatory guidance given Patient agrees with treatment plan Follow-up in 1 month or sooner as needed if symptoms worsen or fail to improve  Darlyne Russian PA-C

## 2018-12-25 NOTE — Patient Instructions (Addendum)
For blood pressure: - stop Clonidine - stop Losartan - start Valsartan 160 mg twice a day - cont Metoprolol 12.5 mg twice a day - cont Amlodipine 10 mg daily  For heart failure: - continue your BP medications and aspirin 325 mg daily - goal BP <130/80 - limit salt to <2000 mg/day - follow-up with Cardiology  For Diabetes: - stop Pioglitazone - reduce Insulin to 23 units and take it after breakfast - monitor morning fasting sugar daily and 2 hours after meals - check blood sugar whenever you don't feel well (nausea, sweating dizziness, increased urination) - limit carbs to 30g per meal and 15g per snack - keep follow-up with Endocrinology - ask about GLP-1 medicines like Trulicity, Ozempic, Bydureon

## 2018-12-26 MED ORDER — FREESTYLE LIBRE 14 DAY READER DEVI
1.0000 | Freq: Four times a day (QID) | 0 refills | Status: DC | PRN
Start: 2018-12-26 — End: 2019-06-19

## 2018-12-26 MED ORDER — FREESTYLE LIBRE 14 DAY SENSOR MISC: 1.0000 | Freq: Four times a day (QID) | 11 refills | Status: DC | PRN

## 2019-01-01 ENCOUNTER — Encounter: Payer: Self-pay | Admitting: Physician Assistant

## 2019-01-01 ENCOUNTER — Telehealth: Payer: Self-pay

## 2019-01-01 DIAGNOSIS — F39 Unspecified mood [affective] disorder: Secondary | ICD-10-CM | POA: Insufficient documentation

## 2019-01-01 DIAGNOSIS — I152 Hypertension secondary to endocrine disorders: Secondary | ICD-10-CM | POA: Insufficient documentation

## 2019-01-01 DIAGNOSIS — E11649 Type 2 diabetes mellitus with hypoglycemia without coma: Secondary | ICD-10-CM | POA: Insufficient documentation

## 2019-01-01 DIAGNOSIS — F419 Anxiety disorder, unspecified: Secondary | ICD-10-CM | POA: Insufficient documentation

## 2019-01-01 DIAGNOSIS — E1159 Type 2 diabetes mellitus with other circulatory complications: Secondary | ICD-10-CM | POA: Insufficient documentation

## 2019-01-01 DIAGNOSIS — I43 Cardiomyopathy in diseases classified elsewhere: Secondary | ICD-10-CM | POA: Insufficient documentation

## 2019-01-01 DIAGNOSIS — I110 Hypertensive heart disease with heart failure: Secondary | ICD-10-CM | POA: Insufficient documentation

## 2019-01-01 NOTE — Telephone Encounter (Signed)
Pt notified.  She is requesting another work note stating that she should only work remotely.  Please advise. -EH/RMA

## 2019-01-01 NOTE — Telephone Encounter (Signed)
Pt left vm stating that she was seen last Tuesday and is requesting a note to return to work. Please advise. -EH/RMA

## 2019-01-01 NOTE — Telephone Encounter (Signed)
Note to return to work today available on MyChart Let patient know if she would like a note for accommodations (such as no travel) to let [place] know the nature of the accommodations requested and documentation required by her employer.

## 2019-01-08 ENCOUNTER — Encounter: Payer: Self-pay | Admitting: Physician Assistant

## 2019-01-08 DIAGNOSIS — E11649 Type 2 diabetes mellitus with hypoglycemia without coma: Secondary | ICD-10-CM

## 2019-01-08 DIAGNOSIS — F4323 Adjustment disorder with mixed anxiety and depressed mood: Secondary | ICD-10-CM

## 2019-01-09 MED ORDER — LORAZEPAM 0.5 MG PO TABS: 0.5000 mg | ORAL_TABLET | Freq: Two times a day (BID) | ORAL | 0 refills | Status: DC | PRN

## 2019-01-09 MED ORDER — PAROXETINE HCL 30 MG PO TABS: 30.0000 mg | ORAL_TABLET | Freq: Every day | ORAL | 0 refills | Status: DC

## 2019-01-09 MED ORDER — INSULIN DETEMIR 100 UNIT/ML FLEXPEN
17.0000 [IU] | PEN_INJECTOR | Freq: Every day | SUBCUTANEOUS | 1 refills | Status: DC
Start: 2019-01-09 — End: 2019-01-29

## 2019-01-09 MED ORDER — INSULIN DETEMIR 100 UNIT/ML FLEXPEN: 17.0000 [IU] | PEN_INJECTOR | Freq: Every day | SUBCUTANEOUS | 1 refills | Status: DC

## 2019-01-09 NOTE — Addendum Note (Signed)
Addended by: Nelson Chimes E on: 01/09/2019 09:09 AM   Modules accepted: Orders

## 2019-01-17 ENCOUNTER — Encounter: Payer: Self-pay | Admitting: Physician Assistant

## 2019-01-17 ENCOUNTER — Ambulatory Visit: Payer: 59 | Admitting: Physician Assistant

## 2019-01-17 VITALS — BP 124/84 | HR 51 | Temp 98.2°F | Wt 273.0 lb

## 2019-01-17 DIAGNOSIS — E11649 Type 2 diabetes mellitus with hypoglycemia without coma: Secondary | ICD-10-CM | POA: Diagnosis not present

## 2019-01-17 DIAGNOSIS — I63313 Cerebral infarction due to thrombosis of bilateral middle cerebral arteries: Secondary | ICD-10-CM

## 2019-01-17 DIAGNOSIS — E1165 Type 2 diabetes mellitus with hyperglycemia: Secondary | ICD-10-CM | POA: Diagnosis not present

## 2019-01-17 DIAGNOSIS — E1169 Type 2 diabetes mellitus with other specified complication: Secondary | ICD-10-CM

## 2019-01-17 DIAGNOSIS — F419 Anxiety disorder, unspecified: Secondary | ICD-10-CM

## 2019-01-17 DIAGNOSIS — I10 Essential (primary) hypertension: Secondary | ICD-10-CM

## 2019-01-17 DIAGNOSIS — I152 Hypertension secondary to endocrine disorders: Secondary | ICD-10-CM

## 2019-01-17 DIAGNOSIS — I43 Cardiomyopathy in diseases classified elsewhere: Secondary | ICD-10-CM

## 2019-01-17 DIAGNOSIS — E1159 Type 2 diabetes mellitus with other circulatory complications: Secondary | ICD-10-CM

## 2019-01-17 DIAGNOSIS — E559 Vitamin D deficiency, unspecified: Secondary | ICD-10-CM

## 2019-01-17 DIAGNOSIS — I110 Hypertensive heart disease with heart failure: Secondary | ICD-10-CM

## 2019-01-17 DIAGNOSIS — E785 Hyperlipidemia, unspecified: Secondary | ICD-10-CM

## 2019-01-17 LAB — POCT GLYCOSYLATED HEMOGLOBIN (HGB A1C): HbA1c, POC (controlled diabetic range): 8.1 % — AB (ref 0.0–7.0)

## 2019-01-17 MED ORDER — VITAMIN D3 25 MCG (1000 UNIT) PO TABS: 1000.0000 [IU] | ORAL_TABLET | Freq: Every day | ORAL | 1 refills | Status: AC

## 2019-01-17 MED ORDER — AMLODIPINE BESYLATE 10 MG PO TABS: 10.0000 mg | ORAL_TABLET | Freq: Every day | ORAL | 1 refills | Status: DC

## 2019-01-17 MED ORDER — TRAZODONE HCL 100 MG PO TABS: 100.0000 mg | ORAL_TABLET | Freq: Every day | ORAL | 1 refills | Status: DC

## 2019-01-17 MED ORDER — METOPROLOL TARTRATE 25 MG PO TABS: 12.5000 mg | ORAL_TABLET | Freq: Two times a day (BID) | ORAL | 1 refills | Status: DC

## 2019-01-17 MED ORDER — ATORVASTATIN CALCIUM 80 MG PO TABS: 80.0000 mg | ORAL_TABLET | Freq: Every day | ORAL | 1 refills | Status: DC

## 2019-01-17 NOTE — Patient Instructions (Signed)
Living With Anxiety    After being diagnosed with an anxiety disorder, you may be relieved to know why you have felt or behaved a certain way. It is natural to also feel overwhelmed about the treatment ahead and what it will mean for your life. With care and support, you can manage this condition and recover from it.  How to cope with anxiety  Dealing with stress  Stress is your body’s reaction to life changes and events, both good and bad. Stress can last just a few hours or it can be ongoing. Stress can play a major role in anxiety, so it is important to learn both how to cope with stress and how to think about it differently.  Talk with your health care provider or a counselor to learn more about stress reduction. He or she may suggest some stress reduction techniques, such as:  · Music therapy. This can include creating or listening to music that you enjoy and that inspires you.  · Mindfulness-based meditation. This involves being aware of your normal breaths, rather than trying to control your breathing. It can be done while sitting or walking.  · Centering prayer. This is a kind of meditation that involves focusing on a word, phrase, or sacred image that is meaningful to you and that brings you peace.  · Deep breathing. To do this, expand your stomach and inhale slowly through your nose. Hold your breath for 3-5 seconds. Then exhale slowly, allowing your stomach muscles to relax.  · Self-talk. This is a skill where you identify thought patterns that lead to anxiety reactions and correct those thoughts.  · Muscle relaxation. This involves tensing muscles then relaxing them.  Choose a stress reduction technique that fits your lifestyle and personality. Stress reduction techniques take time and practice. Set aside 5-15 minutes a day to do them. Therapists can offer training in these techniques. The training may be covered by some insurance plans. Other things you can do to manage stress include:  · Keeping a  stress diary. This can help you learn what triggers your stress and ways to control your response.  · Thinking about how you respond to certain situations. You may not be able to control everything, but you can control your reaction.  · Making time for activities that help you relax, and not feeling guilty about spending your time in this way.  Therapy combined with coping and stress-reduction skills provides the best chance for successful treatment.  Medicines  Medicines can help ease symptoms. Medicines for anxiety include:  · Anti-anxiety drugs.  · Antidepressants.  · Beta-blockers.  Medicines may be used as the main treatment for anxiety disorder, along with therapy, or if other treatments are not working. Medicines should be prescribed by a health care provider.  Relationships  Relationships can play a big part in helping you recover. Try to spend more time connecting with trusted friends and family members. Consider going to couples counseling, taking family education classes, or going to family therapy. Therapy can help you and others better understand the condition.  How to recognize changes in your condition  Everyone has a different response to treatment for anxiety. Recovery from anxiety happens when symptoms decrease and stop interfering with your daily activities at home or work. This may mean that you will start to:  · Have better concentration and focus.  · Sleep better.  · Be less irritable.  · Have more energy.  · Have improved memory.  It is   important to recognize when your condition is getting worse. Contact your health care provider if your symptoms interfere with home or work and you do not feel like your condition is improving.  Where to find help and support:  You can get help and support from these sources:  · Self-help groups.  · Online and community organizations.  · A trusted spiritual leader.  · Couples counseling.  · Family education classes.  · Family therapy.  Follow these instructions  at home:  · Eat a healthy diet that includes plenty of vegetables, fruits, whole grains, low-fat dairy products, and lean protein. Do not eat a lot of foods that are high in solid fats, added sugars, or salt.  · Exercise. Most adults should do the following:  ? Exercise for at least 150 minutes each week. The exercise should increase your heart rate and make you sweat (moderate-intensity exercise).  ? Strengthening exercises at least twice a week.  · Cut down on caffeine, tobacco, alcohol, and other potentially harmful substances.  · Get the right amount and quality of sleep. Most adults need 7-9 hours of sleep each night.  · Make choices that simplify your life.  · Take over-the-counter and prescription medicines only as told by your health care provider.  · Avoid caffeine, alcohol, and certain over-the-counter cold medicines. These may make you feel worse. Ask your pharmacist which medicines to avoid.  · Keep all follow-up visits as told by your health care provider. This is important.  Questions to ask your health care provider  · Would I benefit from therapy?  · How often should I follow up with a health care provider?  · How long do I need to take medicine?  · Are there any long-term side effects of my medicine?  · Are there any alternatives to taking medicine?  Contact a health care provider if:  · You have a hard time staying focused or finishing daily tasks.  · You spend many hours a day feeling worried about everyday life.  · You become exhausted by worry.  · You start to have headaches, feel tense, or have nausea.  · You urinate more than normal.  · You have diarrhea.  Get help right away if:  · You have a racing heart and shortness of breath.  · You have thoughts of hurting yourself or others.  If you ever feel like you may hurt yourself or others, or have thoughts about taking your own life, get help right away. You can go to your nearest emergency department or call:  · Your local emergency services  (911 in the [place]  · A suicide crisis helpline, such as the National Suicide Prevention Lifeline at 1-800-273-8255. This is open 24-hours a day.  Summary  · Taking steps to deal with stress can help calm you.  · Medicines cannot cure anxiety disorders, but they can help ease symptoms.  · Family, friends, and partners can play a big part in helping you recover from an anxiety disorder.  This information is not intended to replace advice given to you by your health care provider. Make sure you discuss any questions you have with your health care provider.  Document Released: 07/19/2016 Document Revised: 07/19/2016 Document Reviewed: 07/19/2016  [employer]vier Interactive Patient Education © 2019 [employer]

## 2019-01-17 NOTE — Progress Notes (Signed)
HPI:                                                                Gail Black is a 48 y.o. female who presents to [place] [facility]: [facility] today for follow-up  Diabetes: evaluated by Endocrinology. C-peptide was elevated c/f type 1 diabetes. She is currently on Levemir 17U daily and Metformin 1000 mg bid. She has only had 1 hypoglycemic episode. Her 4 week average glucose is 91.   HTN: taking Valsartan 160 mg bid, Amlodipine 10 mg QD and Metoprolol 12.5 mg bid. Compliant with medications. Checks BP's at home. Reports typical SBP is 107-120's, high of 140. Denies vision change, headache, chest pain with exertion, orthopnea, lightheadedness, syncope and edema.   Hx of CVA: taking ASA 325 mg and Atorvastatin 80 mg. She was evaluated by neuro on 01/02/19. She has been attending rehab 5 days per week and reports her strength and gait are improving. She is scheduled for a sleep study and 30 day holter. Neuro also recommended increased B12 to 1000 mcg daily  Mood disorder/anxiety: requesting note to be excused from work next week so that she can take time off to rest and celebrate her birthday with her family. Reports employer will not pay her for FMLA and she is back to working very long hours. Admits to increased stress and premature awakening most nights with inability to fall back to sleep. Paxil was inreased to 30 mg 2 weeks ago. She has reduced her Lorazepam 0.5 mg frequency from 2-3 times per day to once daily prn. She is taking Trazodone 50 mg QHS.   Depression screen [facility] 2/9 01/17/2019 01/18/2017  Decreased Interest 1 2  Down, Depressed, Hopeless 1 3  PHQ - 2 Score 2 5  Altered sleeping 2 1  Tired, decreased energy 1 2  Change in appetite 0 3  Feeling bad or failure about yourself  3 3  Trouble concentrating 2 1  Moving slowly or fidgety/restless 0 1  Suicidal thoughts 0 0  PHQ-9 Score 10 16  Difficult doing work/chores Somewhat difficult -     GAD 7 : Generalized Anxiety Score 01/17/2019 01/18/2017  Nervous, Anxious, on Edge 1 3  Control/stop worrying 2 3  Worry too much - different things 2 3  Trouble relaxing 2 3  Restless 1 3  Easily annoyed or irritable 2 3  Afraid - awful might happen 2 3  Total GAD 7 Score 12 21  Anxiety Difficulty Somewhat difficult Extremely difficult      Past Medical History:  Diagnosis Date  . 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 History of postpartum depression   . Hypertension   . Obesity   . PCOS (polycystic ovarian syndrome)   . Preeclampsia   . Type 2 diabetes mellitus with hyperglycemia, without long-term current use of insulin ([place]) 02/01/2017   Past Surgical History:  Procedure Laterality Date  . CESAREAN SECTION  2013   preeclampsia  . CESAREAN SECTION WITH BILATERAL TUBAL LIGATION     Social History   Tobacco Use  . Smoking status: Never Smoker  . Smokeless tobacco: Never Used  Substance Use Topics  . Alcohol use: Yes    Alcohol/week: 1.0 standard drinks  Types: 1 Glasses of wine per week   family history includes Breast cancer in her mother; Hypertension in her father.    ROS: negative except as noted in the HPI  Medications: Current Outpatient Medications  Medication Sig Dispense Refill  . amLODipine (NORVASC) 10 MG tablet Take 1 tablet (10 mg total) by mouth daily. 90 tablet 1  . aspirin 325 MG tablet Take by mouth.    Marland Kitchen atorvastatin (LIPITOR) 80 MG tablet Take 1 tablet (80 mg total) by mouth daily. 90 tablet 1  . cholecalciferol (VITAMIN D) 25 MCG (1000 UT) tablet Take 1 tablet (1,000 Units total) by mouth daily. 90 tablet 1  . Continuous Blood Gluc Receiver (FREESTYLE LIBRE 14 DAY READER) DEVI 1 Device by Does not apply route 4 (four) times daily as needed. 1 Device 0  . Continuous Blood Gluc Sensor (FREESTYLE LIBRE 14 DAY SENSOR) MISC 1 Device by Does not apply route 4 (four) times daily as needed. 2 each 11  . Insulin Detemir (LEVEMIR) 100 UNIT/ML  Pen Inject 17 Units into the skin daily after breakfast. If fasting glucose>150 administer additional 4 units 18 mL 1  . Insulin Pen Needle 32G X 4 MM MISC Use as directed with insulin pen    . LORazepam (ATIVAN) 0.5 MG tablet Take 1 tablet (0.5 mg total) by mouth 2 (two) times daily as needed for anxiety. 40 tablet 0  . METFORMIN HCL PO Take 1,000 mg by mouth 2 (two) times daily with a meal.    . metoprolol tartrate (LOPRESSOR) 25 MG tablet Take 0.5 tablets (12.5 mg total) by mouth 2 (two) times a day. 180 tablet 1  . PARoxetine (PAXIL) 30 MG tablet Take 1 tablet (30 mg total) by mouth daily. 90 tablet 0  . traZODone (DESYREL) 100 MG tablet Take 1 tablet (100 mg total) by mouth at bedtime. 90 tablet 1  . valACYclovir (VALTREX) 1000 MG tablet Take one tab PO every 8 hours 21 tablet 0  . valsartan (DIOVAN) 160 MG tablet Take 1 tablet (160 mg total) by mouth 2 (two) times daily. 180 tablet 0  . vitamin B-12 (CYANOCOBALAMIN) 500 MCG tablet Take by mouth.    . Vitamin D, Ergocalciferol, (DRISDOL) 1.25 MG (50000 UT) CAPS capsule      No current facility-administered medications for this visit.    Allergies  Allergen Reactions  . Clonidine Derivatives Nausea Only and Other (See Comments)    hypotension       Objective:  BP 124/84   Pulse (!) 51   Temp 98.2 F (36.8 C) (Oral)   Wt 273 lb (123.8 kg)   SpO2 96%   BMI 42.76 kg/m  Gen:  alert, not ill-appearing, no distress, appropriate for age HEENT: head normocephalic without obvious abnormality, conjunctiva and cornea clear, trachea midline Pulm: Normal work of breathing, normal phonation, clear to auscultation bilaterally, no wheezes, rales or rhonchi CV: bradycardic rate, regular rhythm, s1 and s2 distinct, no murmurs, clicks or rubs  Neuro: alert and oriented x 3, no tremor MSK: extremities atraumatic, strength 5/5 in bilateral upper extremities, strength slightly decreased on the right side compared to left, normal gait and station,  1+ peripheral edema Skin: intact, no rashes on exposed skin, no jaundice, no cyanosis Psych: well-groomed, cooperative, good eye contact, euthymic mood, affect mood-congruent, speech is articulate, and thought processes clear and goal-directed  Wt Readings from Last 3 Encounters:  01/17/19 273 lb (123.8 kg)  12/25/18 265 lb (120.2 kg)  03/20/18 299  lb (135.6 kg)   BP Readings from Last 3 Encounters:  01/17/19 124/84  12/25/18 (!) 158/77  03/22/18 (!) 172/114   Pulse Readings from Last 3 Encounters:  01/17/19 (!) 51  12/25/18 64  03/22/18 80     Results for orders placed or performed in visit on 01/17/19 (from the past 72 hour(s))  POCT HgB A1C     Status: Abnormal   Collection Time: 01/17/19  9:14 AM  Result Value Ref Range   Hemoglobin A1C     HbA1c POC (<> result, manual entry)     HbA1c, POC (prediabetic range)     HbA1c, POC (controlled diabetic range) 8.1 (A) 0.0 - 7.0 %   No results found.    Assessment and Plan: 48 y.o. female with   .Diagnoses and all orders for this visit:  Type 2 diabetes mellitus with hyperglycemia, without long-term current use of insulin (HCC) -     POCT HgB A1C  Anxiety -     traZODone (DESYREL) 100 MG tablet; Take 1 tablet (100 mg total) by mouth at bedtime.  Uncontrolled type 2 diabetes mellitus with hypoglycemia without coma ([place])  Hypertension associated with diabetes ([place]) -     amLODipine (NORVASC) 10 MG tablet; Take 1 tablet (10 mg total) by mouth daily.  Hypertensive cardiomyopathy, with heart failure (HCC) -     metoprolol tartrate (LOPRESSOR) 25 MG tablet; Take 0.5 tablets (12.5 mg total) by mouth 2 (two) times a day.  Vitamin D deficiency -     cholecalciferol (VITAMIN D) 25 MCG (1000 UT) tablet; Take 1 tablet (1,000 Units total) by mouth daily.  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  Dyslipidemia associated with type 2 diabetes mellitus (HCC) -     atorvastatin (LIPITOR) 80 MG tablet; Take 1 tablet (80 mg total) by mouth daily.   HTN, CVA BP at goal Cont current medications Cont ASA and high intensity statin therapy for secondary prevention Keep f/u with Neuro  Mood disorder, Anxety PHQ9=12, no acute safety issues GAD7=10 Cont Paxil 30 mg for 6-8 weeks, reassess for possible dose titration Increasing Trazodone to 100 mg QHS Cont low-dose Lorazepam prn for breakthrough anxiety. Counseled on risks of tolerance and dependence and importance of limiting use  Diabetes Lab Results  Component Value Date   HGBA1C 8.1 (A) 01/17/2019  Reviewed home glucose log, achieving glycemic targets Cont Levemir Discuss Metformin with Endocrinology  Patient education and anticipatory guidance given Patient agrees with treatment plan Follow-up in 1 month or sooner as needed if symptoms worsen or fail to improve  Darlyne Russian PA-C

## 2019-01-18 ENCOUNTER — Ambulatory Visit: Payer: 59 | Admitting: Physician Assistant

## 2019-01-29 ENCOUNTER — Other Ambulatory Visit: Payer: Self-pay

## 2019-01-29 DIAGNOSIS — E1159 Type 2 diabetes mellitus with other circulatory complications: Secondary | ICD-10-CM

## 2019-01-29 DIAGNOSIS — F419 Anxiety disorder, unspecified: Secondary | ICD-10-CM

## 2019-01-29 DIAGNOSIS — I152 Hypertension secondary to endocrine disorders: Secondary | ICD-10-CM

## 2019-01-29 DIAGNOSIS — F4323 Adjustment disorder with mixed anxiety and depressed mood: Secondary | ICD-10-CM

## 2019-01-29 DIAGNOSIS — I63313 Cerebral infarction due to thrombosis of bilateral middle cerebral arteries: Secondary | ICD-10-CM

## 2019-01-29 DIAGNOSIS — E11649 Type 2 diabetes mellitus with hypoglycemia without coma: Secondary | ICD-10-CM

## 2019-01-29 DIAGNOSIS — I110 Hypertensive heart disease with heart failure: Secondary | ICD-10-CM

## 2019-01-29 DIAGNOSIS — E1169 Type 2 diabetes mellitus with other specified complication: Secondary | ICD-10-CM

## 2019-01-29 MED ORDER — VITAMIN D (ERGOCALCIFEROL) 1.25 MG (50000 UNIT) PO CAPS: 50000.0000 [IU] | ORAL_CAPSULE | ORAL | 0 refills | Status: AC

## 2019-01-29 MED ORDER — LORAZEPAM 0.5 MG PO TABS: 0.5000 mg | ORAL_TABLET | Freq: Two times a day (BID) | ORAL | 1 refills | Status: DC | PRN

## 2019-01-29 MED ORDER — PAROXETINE HCL 30 MG PO TABS: 30.0000 mg | ORAL_TABLET | Freq: Every day | ORAL | 0 refills | Status: DC

## 2019-01-29 MED ORDER — ATORVASTATIN CALCIUM 80 MG PO TABS: 80.0000 mg | ORAL_TABLET | Freq: Every day | ORAL | 1 refills | Status: AC

## 2019-01-29 MED ORDER — FREESTYLE LIBRE 14 DAY SENSOR MISC: 1.0000 | Freq: Four times a day (QID) | 11 refills | Status: DC | PRN

## 2019-01-29 MED ORDER — TRAZODONE HCL 100 MG PO TABS: 100.0000 mg | ORAL_TABLET | Freq: Every day | ORAL | 1 refills | Status: DC

## 2019-01-29 MED ORDER — METFORMIN HCL 1000 MG PO TABS: 1000.0000 mg | ORAL_TABLET | Freq: Two times a day (BID) | ORAL | 1 refills | Status: DC

## 2019-01-29 MED ORDER — VALSARTAN 160 MG PO TABS: 160.0000 mg | ORAL_TABLET | Freq: Two times a day (BID) | ORAL | 0 refills | Status: DC

## 2019-01-29 MED ORDER — METOPROLOL TARTRATE 25 MG PO TABS: 12.5000 mg | ORAL_TABLET | Freq: Two times a day (BID) | ORAL | 1 refills | Status: DC

## 2019-01-29 MED ORDER — AMLODIPINE BESYLATE 10 MG PO TABS: 10.0000 mg | ORAL_TABLET | Freq: Every day | ORAL | 1 refills | Status: DC

## 2019-01-29 MED ORDER — INSULIN PEN NEEDLE 32G X 4 MM MISC: 1 refills | Status: DC

## 2019-01-29 MED ORDER — INSULIN DETEMIR 100 UNIT/ML FLEXPEN: 17.0000 [IU] | PEN_INJECTOR | Freq: Every day | SUBCUTANEOUS | 1 refills | Status: DC

## 2019-01-29 MED ORDER — VALACYCLOVIR HCL 1 G PO TABS: ORAL_TABLET | ORAL | 0 refills | Status: AC

## 2019-02-18 ENCOUNTER — Ambulatory Visit (INDEPENDENT_AMBULATORY_CARE_PROVIDER_SITE_OTHER): Payer: 59 | Admitting: Physician Assistant

## 2019-02-18 ENCOUNTER — Other Ambulatory Visit: Payer: Self-pay

## 2019-02-18 ENCOUNTER — Encounter: Payer: Self-pay | Admitting: Physician Assistant

## 2019-02-18 VITALS — BP 134/86 | HR 54 | Temp 98.1°F | Wt 260.0 lb

## 2019-02-18 DIAGNOSIS — E160 Drug-induced hypoglycemia without coma: Secondary | ICD-10-CM

## 2019-02-18 DIAGNOSIS — R112 Nausea with vomiting, unspecified: Secondary | ICD-10-CM | POA: Diagnosis not present

## 2019-02-18 DIAGNOSIS — Z0100 Encounter for examination of eyes and vision without abnormal findings: Secondary | ICD-10-CM | POA: Diagnosis not present

## 2019-02-18 DIAGNOSIS — E119 Type 2 diabetes mellitus without complications: Secondary | ICD-10-CM

## 2019-02-18 DIAGNOSIS — T383X5A Adverse effect of insulin and oral hypoglycemic [antidiabetic] drugs, initial encounter: Secondary | ICD-10-CM

## 2019-02-18 DIAGNOSIS — E11649 Type 2 diabetes mellitus with hypoglycemia without coma: Secondary | ICD-10-CM | POA: Diagnosis not present

## 2019-02-18 DIAGNOSIS — T50905A Adverse effect of unspecified drugs, medicaments and biological substances, initial encounter: Secondary | ICD-10-CM

## 2019-02-18 MED ORDER — INSULIN DETEMIR 100 UNIT/ML FLEXPEN: 14.0000 [IU] | PEN_INJECTOR | Freq: Every day | SUBCUTANEOUS | 1 refills | Status: DC

## 2019-02-18 MED ORDER — METFORMIN HCL 500 MG PO TABS: 500.0000 mg | ORAL_TABLET | Freq: Two times a day (BID) | ORAL | 0 refills | Status: DC

## 2019-02-18 NOTE — Patient Instructions (Addendum)
CORRECT LOW BLOOD SUGAR (<70) WITH 15G OF SUGAR (see handout)  Diabetes Preventive Care: - annual foot exam  - annual dilated eye exam with an eye doctor - self foot exams at least weekly - pneumonia vaccine once (booster in 5 years and at age 48) - annual influenza vaccine - twice yearly dental cleanings and yearly exam - goal blood pressure <140/90, ideally <130/80 - LDL cholesterol <70 - A1C <7.0 - body mass index (BMI) <25.0 - follow-up every 3 months if your A1C is not at goal - follow-up every 6 months if diabetes is well controlled

## 2019-02-18 NOTE — Progress Notes (Signed)
HPI:                                                                Gail Black is a 48 y.o. female who presents to [address]: [place] today for nausea/vomiting/diarrhea  Patient reports for several days she has had persistent nausea, occasional vomiting immediately after eating, early satiety, and frequent loose stools. She has periods where she feels well and is able to exercise and eat small quantities of food. She has a history of similar symptoms with taking Metformin in the past. She denies fever, chills, malaise, abdominal pain.  No known sick contacts  Currently taking Levemir 15U nightly and Metformin 1000 mg bid. She brought home glucose readings and is having frequent lows that she does not self-correct. When asked why she is not self-correcting she stated that she is trying to lose weight and does not like peanut butter, which is what the diabetes educator recommended. Home glucose readings 02/18/19 AM - 66 02/17/19 AM - 57, 64, 65, 67  Past Medical History:  Diagnosis Date  . 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 History of postpartum depression   . Hypertension   . Obesity   . PCOS (polycystic ovarian syndrome)   . Preeclampsia   . Type 2 diabetes mellitus with hyperglycemia, without long-term current use of insulin ([place]) 02/01/2017   Past Surgical History:  Procedure Laterality Date  . CESAREAN SECTION  2013   preeclampsia  . CESAREAN SECTION WITH BILATERAL TUBAL LIGATION     Social History   Tobacco Use  . Smoking status: Never Smoker  . Smokeless tobacco: Never Used  Substance Use Topics  . Alcohol use: Yes    Alcohol/week: 1.0 standard drinks    Types: 1 Glasses of wine per week   family history includes Breast cancer in her mother; Hypertension in her father.    ROS: negative except as noted in the HPI  Medications: Current Outpatient Medications  Medication Sig Dispense Refill  . amLODipine (NORVASC)  10 MG tablet Take 1 tablet (10 mg total) by mouth daily. 90 tablet 1  . aspirin 325 MG tablet Take by mouth.    Marland Kitchen atorvastatin (LIPITOR) 80 MG tablet Take 1 tablet (80 mg total) by mouth daily. 90 tablet 1  . cholecalciferol (VITAMIN D) 25 MCG (1000 UT) tablet Take 1 tablet (1,000 Units total) by mouth daily. 90 tablet 1  . Continuous Blood Gluc Receiver (FREESTYLE LIBRE 14 DAY READER) DEVI 1 Device by Does not apply route 4 (four) times daily as needed. 1 Device 0  . Continuous Blood Gluc Sensor (FREESTYLE LIBRE 14 DAY SENSOR) MISC 1 Device by Does not apply route 4 (four) times daily as needed. 2 each 11  . Insulin Detemir (LEVEMIR) 100 UNIT/ML Pen Inject 14 Units into the skin daily after breakfast. If fasting glucose>150 administer additional 4 units 18 mL 1  . Insulin Pen Needle 32G X 4 MM MISC Use as directed with insulin pen 100 each 1  . LORazepam (ATIVAN) 0.5 MG tablet Take 1 tablet (0.5 mg total) by mouth 2 (two) times daily as needed for anxiety. 45 tablet 1  . metFORMIN (GLUCOPHAGE) 500 MG tablet Take 1 tablet (500 mg total)  by mouth 2 (two) times daily with a meal. 180 tablet 0  . metoprolol tartrate (LOPRESSOR) 25 MG tablet Take 0.5 tablets (12.5 mg total) by mouth 2 (two) times a day. 180 tablet 1  . PARoxetine (PAXIL) 30 MG tablet Take 1 tablet (30 mg total) by mouth daily. 90 tablet 0  . traZODone (DESYREL) 100 MG tablet Take 1 tablet (100 mg total) by mouth at bedtime. 90 tablet 1  . valACYclovir (VALTREX) 1000 MG tablet Take one tab PO every 8 hours 21 tablet 0  . valsartan (DIOVAN) 160 MG tablet Take 1 tablet (160 mg total) by mouth 2 (two) times daily. 180 tablet 0  . vitamin B-12 (CYANOCOBALAMIN) 500 MCG tablet Take 1,000 mcg by mouth daily.    . Vitamin D, Ergocalciferol, (DRISDOL) 1.25 MG (50000 UT) CAPS capsule Take 1 capsule (50,000 Units total) by mouth every 7 (seven) days. 5 capsule 0   No current facility-administered medications for this visit.    Allergies   Allergen Reactions  . Clonidine Derivatives Nausea Only and Other (See Comments)    hypotension       Objective:  BP 134/86   Pulse (!) 54   Temp 98.1 F (36.7 C) (Oral)   Wt 260 lb (117.9 kg)   BMI 40.72 kg/m  Wt Readings from Last 3 Encounters:  02/18/19 260 lb (117.9 kg)  01/17/19 273 lb (123.8 kg)  12/25/18 265 lb (120.2 kg)   BP Readings from Last 3 Encounters:  02/18/19 134/86  01/17/19 124/84  12/25/18 (!) 158/77   Pulse Readings from Last 3 Encounters:  02/18/19 (!) 54  01/17/19 (!) 51  12/25/18 64    Gen:  alert, not ill-appearing, no distress, appropriate for age 30: head normocephalic without obvious abnormality, conjunctiva and cornea clear, trachea midline Pulm: Normal work of breathing, normal phonation, clear to auscultation bilaterally, no wheezes, rales or rhonchi CV: Normal rate, regular rhythm, s1 and s2 distinct, no murmurs, clicks or rubs  GI: abdomen soft, non-tender, no guarding or rigidity, exam limited due to body habitus Neuro: alert and oriented x 3, no tremor MSK: extremities atraumatic, normal gait and station, no peripheral edema Skin: intact, no rashes on exposed skin, no jaundice, no cyanosis   Diabetic Foot Exam - Simple   Simple Foot Form Diabetic Foot exam was performed with the following findings: Yes 02/18/2019  9:28 AM  Visual Inspection No deformities, no ulcerations, no other skin breakdown bilaterally: Yes Sensation Testing Intact to touch and monofilament testing bilaterally: Yes Pulse Check Posterior Tibialis and Dorsalis pulse intact bilaterally: Yes Comments    Recent Results (from the past 2160 hour(s))  POCT HgB A1C     Status: Abnormal   Collection Time: 01/17/19  9:14 AM  Result Value Ref Range   Hemoglobin A1C     HbA1c POC (<> result, manual entry)     HbA1c, POC (prediabetic range)     HbA1c, POC (controlled diabetic range) 8.1 (A) 0.0 - 7.0 %      Assessment and Plan: 48 y.o. female with    .Aideliz was seen today for emesis and diarrhea.  Diagnoses and all orders for this visit:  Drug-induced nausea and vomiting  Hypoglycemia due to insulin  Uncontrolled type 2 diabetes mellitus with hypoglycemia without coma (HCC) -     metFORMIN (GLUCOPHAGE) 500 MG tablet; Take 1 tablet (500 mg total) by mouth 2 (two) times daily with a meal. -     Insulin Detemir (LEVEMIR) 100  UNIT/ML Pen; Inject 14 Units into the skin daily after breakfast. If fasting glucose>150 administer additional 4 units  Diabetic eye exam ([place]) -     Cancel: Ambulatory referral to Ophthalmology  Encounter for diabetic foot exam ([place])    Reduce Metformin to 500 mg bid with meals Reduce Levemir to 14U daily Counseled patient to self-correct low glucose <70 and provided her with alternatives to peanut butter Eye Exam UTD per patient, requesting record from Dr. Haynes Bast   Patient education and anticipatory guidance given Patient agrees with treatment plan Follow-up in 6 weeks or sooner as needed if symptoms worsen or fail to improve  Darlyne Russian PA-C

## 2019-02-24 DIAGNOSIS — R112 Nausea with vomiting, unspecified: Secondary | ICD-10-CM | POA: Insufficient documentation

## 2019-02-24 DIAGNOSIS — E160 Drug-induced hypoglycemia without coma: Secondary | ICD-10-CM | POA: Insufficient documentation

## 2019-02-24 DIAGNOSIS — T50905A Adverse effect of unspecified drugs, medicaments and biological substances, initial encounter: Secondary | ICD-10-CM | POA: Insufficient documentation

## 2019-03-09 ENCOUNTER — Other Ambulatory Visit: Payer: Self-pay | Admitting: Physician Assistant

## 2019-03-09 DIAGNOSIS — E11649 Type 2 diabetes mellitus with hypoglycemia without coma: Secondary | ICD-10-CM

## 2019-03-11 ENCOUNTER — Encounter: Payer: Self-pay | Admitting: Physician Assistant

## 2019-03-13 ENCOUNTER — Ambulatory Visit (INDEPENDENT_AMBULATORY_CARE_PROVIDER_SITE_OTHER): Payer: 59 | Admitting: Physician Assistant

## 2019-03-13 ENCOUNTER — Encounter: Payer: Self-pay | Admitting: Physician Assistant

## 2019-03-13 ENCOUNTER — Ambulatory Visit (INDEPENDENT_AMBULATORY_CARE_PROVIDER_SITE_OTHER): Payer: 59

## 2019-03-13 ENCOUNTER — Other Ambulatory Visit: Payer: Self-pay

## 2019-03-13 VITALS — BP 131/82 | HR 54 | Temp 98.1°F | Wt 254.0 lb

## 2019-03-13 DIAGNOSIS — R1114 Bilious vomiting: Secondary | ICD-10-CM | POA: Diagnosis not present

## 2019-03-13 DIAGNOSIS — I878 Other specified disorders of veins: Secondary | ICD-10-CM | POA: Diagnosis not present

## 2019-03-13 DIAGNOSIS — R252 Cramp and spasm: Secondary | ICD-10-CM | POA: Insufficient documentation

## 2019-03-13 DIAGNOSIS — K5730 Diverticulosis of large intestine without perforation or abscess without bleeding: Secondary | ICD-10-CM | POA: Insufficient documentation

## 2019-03-13 DIAGNOSIS — M4310 Spondylolisthesis, site unspecified: Secondary | ICD-10-CM

## 2019-03-13 DIAGNOSIS — M4300 Spondylolysis, site unspecified: Secondary | ICD-10-CM

## 2019-03-13 DIAGNOSIS — R1032 Left lower quadrant pain: Secondary | ICD-10-CM | POA: Diagnosis not present

## 2019-03-13 DIAGNOSIS — E1169 Type 2 diabetes mellitus with other specified complication: Secondary | ICD-10-CM

## 2019-03-13 DIAGNOSIS — E785 Hyperlipidemia, unspecified: Secondary | ICD-10-CM

## 2019-03-13 DIAGNOSIS — D1779 Benign lipomatous neoplasm of other sites: Secondary | ICD-10-CM

## 2019-03-13 DIAGNOSIS — I63313 Cerebral infarction due to thrombosis of bilateral middle cerebral arteries: Secondary | ICD-10-CM

## 2019-03-13 HISTORY — DX: Spondylolysis, site unspecified: M43.00

## 2019-03-13 HISTORY — DX: Diverticulosis of large intestine without perforation or abscess without bleeding: K57.30

## 2019-03-13 HISTORY — DX: Benign lipomatous neoplasm of other sites: D17.79

## 2019-03-13 HISTORY — DX: Spondylolisthesis, site unspecified: M43.10

## 2019-03-13 LAB — COMPLETE METABOLIC PANEL WITH GFR
AG Ratio: 1.5 (calc) (ref 1.0–2.5)
ALT: 24 U/L (ref 6–29)
AST: 20 U/L (ref 10–35)
Albumin: 4.3 g/dL (ref 3.6–5.1)
Alkaline phosphatase (APISO): 49 U/L (ref 31–125)
BUN: 15 mg/dL (ref 7–25)
CO2: 26 mmol/L (ref 20–32)
Calcium: 9.5 mg/dL (ref 8.6–10.2)
Chloride: 102 mmol/L (ref 98–110)
Creat: 0.9 mg/dL (ref 0.50–1.10)
GFR, Est African American: 89 mL/min/{1.73_m2} (ref >=60)
GFR, Est Non African American: 77 mL/min/{1.73_m2} (ref >=60)
Globulin: 2.9 g/dL (calc) (ref 1.9–3.7)
Glucose, Bld: 95 mg/dL (ref 65–99)
Potassium: 3.9 mmol/L (ref 3.5–5.3)
Sodium: 138 mmol/L (ref 135–146)
Total Bilirubin: 0.7 mg/dL (ref 0.2–1.2)
Total Protein: 7.2 g/dL (ref 6.1–8.1)

## 2019-03-13 LAB — CBC WITH DIFFERENTIAL/PLATELET
Absolute Monocytes: 533 cells/uL (ref 200–950)
Basophils Absolute: 52 cells/uL (ref 0–200)
Basophils Relative: 0.6 %
Eosinophils Absolute: 292 cells/uL (ref 15–500)
Eosinophils Relative: 3.4 %
HCT: 41.1 % (ref 35.0–45.0)
Hemoglobin: 13.6 g/dL (ref 11.7–15.5)
Lymphs Abs: 1763 cells/uL (ref 850–3900)
MCH: 28 pg (ref 27.0–33.0)
MCHC: 33.1 g/dL (ref 32.0–36.0)
MCV: 84.7 fL (ref 80.0–100.0)
MPV: 10.9 fL (ref 7.5–12.5)
Monocytes Relative: 6.2 %
Neutro Abs: 5960 cells/uL (ref 1500–7800)
Neutrophils Relative %: 69.3 %
Platelets: 245 10*3/uL (ref 140–400)
RBC: 4.85 10*6/uL (ref 3.80–5.10)
RDW: 14.7 % (ref 11.0–15.0)
Total Lymphocyte: 20.5 %
WBC: 8.6 10*3/uL (ref 3.8–10.8)

## 2019-03-13 LAB — CK: Total CK: 68 U/L (ref 29–143)

## 2019-03-13 LAB — LIPASE: Lipase: 20 U/L (ref 7–60)

## 2019-03-13 MED ORDER — PANTOPRAZOLE SODIUM 40 MG PO TBEC: 40.0000 mg | DELAYED_RELEASE_TABLET | Freq: Every day | ORAL | 0 refills | Status: DC

## 2019-03-13 MED ORDER — IOHEXOL 300 MG/ML  SOLN
100.0000 mL | Freq: Once | INTRAMUSCULAR | Status: AC | PRN
  Administered 2019-03-13: 100 mL via INTRAVENOUS

## 2019-03-13 MED ORDER — ONDANSETRON HCL 4 MG PO TABS
8.0000 mg | ORAL_TABLET | Freq: Once | ORAL | Status: AC
  Administered 2019-03-13: 8 mg via ORAL

## 2019-03-13 MED ORDER — ONDANSETRON 8 MG PO TBDP: 8.0000 mg | ORAL_TABLET | Freq: Three times a day (TID) | ORAL | 0 refills | Status: AC | PRN

## 2019-03-13 MED ORDER — SODIUM CHLORIDE 0.9 % IV BOLUS
1000.0000 mL | Freq: Once | INTRAVENOUS | Status: AC
  Administered 2019-03-13: 11:00:00 1000 mL via INTRAVENOUS

## 2019-03-13 MED ORDER — SODIUM CHLORIDE 0.9 % IV BOLUS
1000.0000 mL | Freq: Once | INTRAVENOUS | Status: AC
  Administered 2019-03-13: 800 mL via INTRAVENOUS

## 2019-03-13 NOTE — Progress Notes (Signed)
HPI:                                                                Gail Black is a 48 y.o. female who presents to [facility]: [place] today for abdominal cramping     Abdominal Pain This is a new problem. The current episode started in the past 7 days (Sunday). The onset quality is gradual. The problem occurs constantly. The problem has been gradually worsening. The pain is located in the LLQ. The pain is moderate. The quality of the pain is cramping (squeezing). The abdominal pain radiates to the left flank. Associated symptoms include anorexia, diarrhea (hourly), frequency, melena (Monday), nausea, vomiting (3-4 times today) and weight loss (19 lb in 8 weeks). Pertinent negatives include no fever. The pain is aggravated by palpation. The pain is relieved by nothing. Treatments tried: Imodium. Her past medical history is significant for abdominal surgery (intraabdominal abscess 2012, tubal ligation) and PUD.  Denies chance of pregnancy. S/p tubal ligation. Took negative home preg test yesterday.  Also endorses nocturnal leg cramps in her medial thigh almost nightly for several weeks.   Past Medical History:  Diagnosis Date  . 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 History of postpartum depression   . Hypertension   . Obesity   . PCOS (polycystic ovarian syndrome)   . Preeclampsia   . Type 2 diabetes mellitus with hyperglycemia, without long-term current use of insulin ([place]) 02/01/2017   Past Surgical History:  Procedure Laterality Date  . CESAREAN SECTION  2013   preeclampsia  . CESAREAN SECTION WITH BILATERAL TUBAL LIGATION     Social History   Tobacco Use  . Smoking status: Never Smoker  . Smokeless tobacco: Never Used  Substance Use Topics  . Alcohol use: Yes    Alcohol/week: 1.0 standard drinks    Types: 1 Glasses of wine per week   family history includes Breast cancer in her mother; Hypertension in her  father.    ROS: negative except as noted in the HPI  Medications: Current Outpatient Medications  Medication Sig Dispense Refill  . amLODipine (NORVASC) 10 MG tablet Take 1 tablet (10 mg total) by mouth daily. 90 tablet 1  . aspirin 325 MG tablet Take by mouth.    Marland Kitchen atorvastatin (LIPITOR) 80 MG tablet Take 1 tablet (80 mg total) by mouth daily. 90 tablet 1  . cholecalciferol (VITAMIN D) 25 MCG (1000 UT) tablet Take 1 tablet (1,000 Units total) by mouth daily. 90 tablet 1  . Continuous Blood Gluc Receiver (FREESTYLE LIBRE 14 DAY READER) DEVI 1 Device by Does not apply route 4 (four) times daily as needed. 1 Device 0  . Continuous Blood Gluc Sensor (FREESTYLE LIBRE 14 DAY SENSOR) MISC 1 Device by Does not apply route 4 (four) times daily as needed. 2 each 11  . Insulin Detemir (LEVEMIR) 100 UNIT/ML Pen Inject 14 Units into the skin daily after breakfast. If fasting glucose>150 administer additional 4 units 18 mL 1  . Insulin Pen Needle 32G X 4 MM MISC Use as directed with insulin pen 100 each 1  . LORazepam (ATIVAN) 0.5 MG tablet Take 1 tablet (0.5 mg total) by mouth 2 (two) times daily as needed  for anxiety. 45 tablet 1  . metFORMIN (GLUCOPHAGE) 500 MG tablet TAKE 1 TABLET BY MOUTH  TWICE DAILY WITH A MEAL 60 tablet 0  . metoprolol tartrate (LOPRESSOR) 25 MG tablet Take 0.5 tablets (12.5 mg total) by mouth 2 (two) times a day. 180 tablet 1  . PARoxetine (PAXIL) 30 MG tablet Take 1 tablet (30 mg total) by mouth daily. 90 tablet 0  . traZODone (DESYREL) 100 MG tablet Take 1 tablet (100 mg total) by mouth at bedtime. 90 tablet 1  . valACYclovir (VALTREX) 1000 MG tablet Take one tab PO every 8 hours 21 tablet 0  . valsartan (DIOVAN) 160 MG tablet Take 1 tablet (160 mg total) by mouth 2 (two) times daily. 180 tablet 0  . vitamin B-12 (CYANOCOBALAMIN) 500 MCG tablet Take 1,000 mcg by mouth daily.    . Vitamin D, Ergocalciferol, (DRISDOL) 1.25 MG (50000 UT) CAPS capsule Take 1 capsule (50,000 Units  total) by mouth every 7 (seven) days. 5 capsule 0   No current facility-administered medications for this visit.    Allergies  Allergen Reactions  . Clonidine Derivatives Nausea Only and Other (See Comments)    hypotension       Objective:  BP 131/82   Pulse (!) 54   Temp 98.1 F (36.7 C) (Oral)   Wt 254 lb (115.2 kg)   SpO2 97%   BMI 39.78 kg/m   Vitals:   03/13/19 0856 03/13/19 0912  BP: (!) 148/94 131/82  Pulse: (!) 54   Temp: 98.1 F (36.7 C)   SpO2: 97%     Wt Readings from Last 3 Encounters:  03/13/19 254 lb (115.2 kg)  02/18/19 260 lb (117.9 kg)  01/17/19 273 lb (123.8 kg)   Temp Readings from Last 3 Encounters:  03/13/19 98.1 F (36.7 C) (Oral)  02/18/19 98.1 F (36.7 C) (Oral)  01/17/19 98.2 F (36.8 C) (Oral)   BP Readings from Last 3 Encounters:  03/13/19 131/82  02/18/19 134/86  01/17/19 124/84   Pulse Readings from Last 3 Encounters:  03/13/19 (!) 54  02/18/19 (!) 54  01/17/19 (!) 51    Gen:  alert,  ill-appearing, no distress, appropriate for age, not toxic appearing, HEENT: head normocephalic without obvious abnormality, conjunctiva and cornea clear, trachea midline Pulm: Normal work of breathing, normal phonation, clear to auscultation bilaterally, no wheezes, rales or rhonchi CV: Normal rate, regular rhythm, s1 and s2 distinct, no murmurs, clicks or rubs  GI: abdomen soft, there is LLQ tenderness with light palpation with guarding, palpation of LUQ reproduces pain in the left flank, no palpable masses, exam limited due to body habitus Neuro: alert and oriented x 3, no tremor MSK: extremities atraumatic, normal gait and station, 1+ peripheral edema bilaterally Skin: intact, no rashes on exposed skin, no jaundice, no cyanosis  Recent Results (from the past 2160 hour(s))  POCT HgB A1C     Status: Abnormal   Collection Time: 01/17/19  9:14 AM  Result Value Ref Range   Hemoglobin A1C     HbA1c POC (<> result, manual entry)     HbA1c,  POC (prediabetic range)     HbA1c, POC (controlled diabetic range) 8.1 (A) 0.0 - 7.0 %     No results found for this or any previous visit (from the past 72 hour(s)). No results found.    Assessment and Plan: 48 y.o. female with   .Diagnoses and all orders for this visit:  Abdominal cramping -  Cancel: COMPLETE METABOLIC PANEL WITH GFR -     Cancel: CBC with Differential/Platelet -     Cancel: Lipase -     Urinalysis w microscopic + reflex cultur -     CBC with Differential/Platelet -     COMPLETE METABOLIC PANEL WITH GFR -     Lipase -     ondansetron (ZOFRAN) tablet 8 mg  Leg cramping -     Cancel: CK -     Cancel: COMPLETE METABOLIC PANEL WITH GFR -     Cancel: CBC with Differential/Platelet -     CBC with Differential/Platelet -     COMPLETE METABOLIC PANEL WITH GFR -     CK  LLQ abdominal pain -     Cancel: COMPLETE METABOLIC PANEL WITH GFR -     Cancel: CBC with Differential/Platelet -     Cancel: Lipase -     Urinalysis w microscopic + reflex cultur  Left lower quadrant abdominal pain -     CT Abdomen Pelvis W Contrast -     CBC with Differential/Platelet -     COMPLETE METABOLIC PANEL WITH GFR -     Lipase -     sodium chloride 0.9 % bolus 1,000 mL -     sodium chloride 0.9 % bolus 1,000 mL  Poor venous access  Bilious vomiting with nausea -     sodium chloride 0.9 % bolus 1,000 mL -     ondansetron (ZOFRAN) tablet 8 mg -     sodium chloride 0.9 % bolus 1,000 mL   DDx includes diverticulitis, colitis, and medication adverse reaction 1.8L NS given in office today  8 mg zofran SL given Patient reported resolution of nausea and improvement of her abdominal pain CT Abd/Pel scheduled for 4 pm today CBC w diff, CMP, lipase pending Discontinue Metformin  Nocturnal leg cramps - CMP and CK pending. If CK elevated, will consider lowering Atorvastatin from 80 mg to 40 mg and consider adding Repatha due to prior CVA   Patient education and anticipatory  guidance given Patient agrees with treatment plan Follow-up pending lab results   Darlyne Russian PA-C

## 2019-03-13 NOTE — Progress Notes (Signed)
Subjective:    CC: Poor venous access  HPI: This is a pleasant 48 year old female, she has had some abdominal pain, dehydration, I am called for further evaluation and definitive treatment regarding obtaining venous access, attempts have been made prior and failed.   Symptoms are moderate, persistent.  I reviewed the past medical history, family history, social history, surgical history, and allergies today and no changes were needed.  Please see the problem list section below in epic for further details.  Past Medical History: Past Medical History:  Diagnosis Date  . Benign tumor of pituitary gland ([place])   . History of postpartum depression   . Hypertension   . Obesity   . PCOS (polycystic ovarian syndrome)   . Preeclampsia   . Type 2 diabetes mellitus with hyperglycemia, without long-term current use of insulin ([place]) 02/01/2017   Past Surgical History: Past Surgical History:  Procedure Laterality Date  . CESAREAN SECTION  2013   preeclampsia  . CESAREAN SECTION WITH BILATERAL TUBAL LIGATION     Social History: Social History   Socioeconomic History  . Marital status: Married    Spouse name: Not on file  . Number of children: Not on file  . Years of education: Not on file  . Highest education level: Not on file  Occupational History  . Not on file  Social Needs  . Financial resource strain: Not on file  . Food insecurity    Worry: Not on file    Inability: Not on file  . Transportation needs    Medical: Not on file    Non-medical: Not on file  Tobacco Use  . Smoking status: Never Smoker  . Smokeless tobacco: Never Used  Substance and Sexual Activity  . Alcohol use: Yes    Alcohol/week: 1.0 standard drinks    Types: 1 Glasses of wine per week  . Drug use: No  . Sexual activity: Yes    Birth control/protection: Surgical  Lifestyle  . Physical activity    Days per week: Not on file    Minutes per session: Not on file  . Stress: Not on file  Relationships   . Social Herbalist on phone: Not on file    Gets together: Not on file    Attends religious service: Not on file    Active member of club or organization: Not on file    Attends meetings of clubs or organizations: Not on file    Relationship status: Not on file  Other Topics Concern  . Not on file  Social History Narrative  . Not on file   Family History: Family History  Problem Relation Age of Onset  . Breast cancer Mother   . Hypertension Father    Allergies: Allergies  Allergen Reactions  . Clonidine Derivatives Nausea Only and Other (See Comments)    hypotension   Medications: See med rec.  Review of Systems: No fevers, chills, night sweats, weight loss, chest pain, or shortness of breath.   Objective:    General: Well Developed, well nourished, and in no acute distress.  Neuro: Alert and oriented x3, extra-ocular muscles intact, sensation grossly intact.  HEENT: Normocephalic, atraumatic, pupils equal round reactive to light, neck supple, no masses, no lymphadenopathy, thyroid nonpalpable.  Skin: Warm and dry, no rashes. Cardiac: Regular rate and rhythm, no murmurs rubs or gallops, no lower extremity edema.  Respiratory: Clear to auscultation bilaterally. Not using accessory muscles, speaking in full sentences.  I was able  to place a 20-gauge angiocatheter in the left cubital vein as well as draw blood from the left dorsal venous plexus of the hand.  Impression and Recommendations:    Poor venous access 20-gauge angiocatheter in the left cubital vein, blood drawn from the left dorsal venous plexus of the hand. Further fluid management per primary treating provider.   ___________________________________________ Gwen Her. Dianah Field, M.D., ABFM., CAQSM. Primary Care and Sports Medicine [facility]  Adjunct Professor of [place] [employer]

## 2019-03-13 NOTE — Addendum Note (Signed)
Addended by: Nelson Chimes E on: 03/13/2019 09:24 PM   Modules accepted: Orders

## 2019-03-13 NOTE — Progress Notes (Signed)
Also, I forgot to mention what is going on with your lumbar spine. You have degenerative changes and a pars defect (overuse injury) in your low back (L5) I would like you to avoid anti-inflammatories for now until your abdominal pain/nausea is resolved (no Advil, Aleve, Goody's etc.). You can take Tylenol Arthritis strength [phone] mg every 8 hours Schedule a follow-up appointment with Sports Medicine in a couple weeks if your back is still bothering you

## 2019-03-13 NOTE — Assessment & Plan Note (Signed)
20-gauge angiocatheter in the left cubital vein, blood drawn from the left dorsal venous plexus of the hand. Further fluid management per primary treating provider.

## 2019-03-13 NOTE — Progress Notes (Signed)
Hi Gail Black,  As discussed on the phone tonight... CT does not show any acute infection or cause for your symptoms There is incidental finding of a benign fatty mass on your right adrenal called a myelolipoma There is also a tiny umbilical hernia  Plan is: Clear liquid diet (Pedialyte/Gatorade, broth, popsicles, diluted juice that does not contain pulp). No black coffee :) Stop Metformin going forward Keep a close eye on your blood sugar - you are at risk of low blood sugar Plan to cut your insulin dose in half until you are eating a normal diet again  Pantoprazole to treat gastritis from the nausea/vomting Zofran for nausea/vomiting Recheck in office on Friday

## 2019-03-14 ENCOUNTER — Telehealth: Payer: 59 | Admitting: Physician Assistant

## 2019-03-14 ENCOUNTER — Other Ambulatory Visit: Payer: Self-pay | Admitting: Physician Assistant

## 2019-03-15 ENCOUNTER — Ambulatory Visit: Payer: 59 | Admitting: Physician Assistant

## 2019-03-24 ENCOUNTER — Other Ambulatory Visit: Payer: Self-pay | Admitting: Physician Assistant

## 2019-03-24 DIAGNOSIS — I110 Hypertensive heart disease with heart failure: Secondary | ICD-10-CM

## 2019-03-27 ENCOUNTER — Other Ambulatory Visit: Payer: Self-pay | Admitting: Physician Assistant

## 2019-03-27 DIAGNOSIS — E11649 Type 2 diabetes mellitus with hypoglycemia without coma: Secondary | ICD-10-CM

## 2019-04-01 ENCOUNTER — Ambulatory Visit: Payer: 59 | Admitting: Physician Assistant

## 2019-04-12 ENCOUNTER — Encounter: Payer: Self-pay | Admitting: Physician Assistant

## 2019-05-05 ENCOUNTER — Encounter: Payer: Self-pay | Admitting: Physician Assistant

## 2019-05-06 ENCOUNTER — Encounter (HOSPITAL_BASED_OUTPATIENT_CLINIC_OR_DEPARTMENT_OTHER): Payer: 59 | Admitting: Neurology

## 2019-05-06 MED ORDER — CODITUSS DM PO
16.00 | ORAL | Status: DC
Start: 2019-05-08 — End: 2019-05-06

## 2019-05-06 MED ORDER — FML OP
1000.00 | OPHTHALMIC | Status: DC
Start: 2019-05-08 — End: 2019-05-06

## 2019-05-06 MED ORDER — LOVENOX 150 MG/ML ~~LOC~~ SOLN
0.50 | SUBCUTANEOUS | Status: DC
Start: ? — End: 2019-05-06

## 2019-05-06 MED ORDER — Medication
10.00 | Status: DC
Start: 2019-05-08 — End: 2019-05-06

## 2019-05-06 MED ORDER — LABETALOL HCL 5 MG/ML IV SOLN
20.00 | INTRAVENOUS | Status: DC
Start: ? — End: 2019-05-06

## 2019-05-06 MED ORDER — QUINERVA 260 MG PO TABS
650.00 | ORAL_TABLET | ORAL | Status: DC
Start: ? — End: 2019-05-06

## 2019-05-06 MED ORDER — RA COLD/COUGH DM 15-1-5 MG/5ML PO ELIX
80.00 | ORAL_SOLUTION | ORAL | Status: DC
Start: 2019-05-07 — End: 2019-05-06

## 2019-05-06 MED ORDER — AMINO ACIDS PO BAR
0.50 | CHEWABLE_BAR | ORAL | Status: DC
Start: 2019-05-07 — End: 2019-05-06

## 2019-05-06 MED ORDER — BARO-CAT PO
10.00 | ORAL | Status: DC
Start: ? — End: 2019-05-06

## 2019-05-06 MED ORDER — EPINEPHRINESNAP-V IJ
10.00 | INTRAMUSCULAR | Status: DC
Start: ? — End: 2019-05-06

## 2019-05-06 MED ORDER — BAYER WOMENS 81-300 MG PO TABS
40.00 | ORAL_TABLET | ORAL | Status: DC
Start: 2019-05-08 — End: 2019-05-06

## 2019-05-06 MED ORDER — TGT EYE ALLERGY RELIEF 0.027-0.315 % OP SOLN
50.00 | OPHTHALMIC | Status: DC
Start: ? — End: 2019-05-06

## 2019-05-06 MED ORDER — ACETAMINOPHEN 80 MG PO CPSP
0.50 | ORAL_CAPSULE | ORAL | Status: DC
Start: ? — End: 2019-05-06

## 2019-05-06 MED ORDER — FABRAZYME IV
1000.00 | INTRAVENOUS | Status: DC
Start: 2019-05-08 — End: 2019-05-06

## 2019-05-06 MED ORDER — R-TANNIC-S A/D 30-5 MG/5ML PO SUSP
100.00 | ORAL | Status: DC
Start: 2019-05-07 — End: 2019-05-06

## 2019-05-06 MED ORDER — Medication
Status: DC
Start: ? — End: 2019-05-06

## 2019-05-06 MED ORDER — DERMAKLENZ EX
100.00 | CUTANEOUS | Status: DC
Start: 2019-05-08 — End: 2019-05-06

## 2019-05-06 MED ORDER — TRANQUILITY SLIMLINE BRIEF YTH MISC
325.00 | Status: DC
Start: 2019-05-08 — End: 2019-05-06

## 2019-05-06 MED ORDER — EQUATE NICOTINE 4 MG MT GUM
4.00 | CHEWING_GUM | OROMUCOSAL | Status: DC
Start: ? — End: 2019-05-06

## 2019-05-06 NOTE — Telephone Encounter (Signed)
Okay, let us get her scheduled with Dr. Sheppard Coil when she returns.  Not sure if this is an issue that I can help her with over a phone encounter.  And it sounds like she already has an OB/GYN.

## 2019-05-07 ENCOUNTER — Telehealth: Payer: Self-pay | Admitting: Family Medicine

## 2019-05-07 NOTE — Telephone Encounter (Signed)
Pt scheduled for 05/09/19 -EH/RMA

## 2019-05-07 NOTE — Telephone Encounter (Signed)
Patient was seen in the hospital with an episode of mania and acute encephalopathy thought to be related to abrupt cessation of her psych medication.  She was treated with Resporal.  Additionally she had significant GI upset and had GI work-up as well.  She should have follow-up appointment with Dr. Sheppard Coil or myself or 1 of the other providers in the near future.  I do not think this can wait until the end of October.  Please schedule appointment soon.Marland Kitchen

## 2019-05-08 MED ORDER — DERMACAINE-ALOE 6-0.1 % EX CREA
4.00 | TOPICAL_CREAM | CUTANEOUS | Status: DC
Start: ? — End: 2019-05-08

## 2019-05-08 MED ORDER — Medication
Status: DC
Start: ? — End: 2019-05-08

## 2019-05-09 ENCOUNTER — Ambulatory Visit (INDEPENDENT_AMBULATORY_CARE_PROVIDER_SITE_OTHER): Payer: 59 | Admitting: Physician Assistant

## 2019-05-09 ENCOUNTER — Other Ambulatory Visit: Payer: Self-pay

## 2019-05-09 ENCOUNTER — Encounter: Payer: Self-pay | Admitting: Physician Assistant

## 2019-05-09 VITALS — BP 144/84 | HR 58 | Temp 97.8°F | Wt 237.0 lb

## 2019-05-09 DIAGNOSIS — K251 Acute gastric ulcer with perforation: Secondary | ICD-10-CM

## 2019-05-09 DIAGNOSIS — E1165 Type 2 diabetes mellitus with hyperglycemia: Secondary | ICD-10-CM | POA: Diagnosis not present

## 2019-05-09 DIAGNOSIS — F419 Anxiety disorder, unspecified: Secondary | ICD-10-CM

## 2019-05-09 DIAGNOSIS — R4182 Altered mental status, unspecified: Secondary | ICD-10-CM | POA: Diagnosis not present

## 2019-05-09 DIAGNOSIS — B372 Candidiasis of skin and nail: Secondary | ICD-10-CM

## 2019-05-09 DIAGNOSIS — Z833 Family history of diabetes mellitus: Secondary | ICD-10-CM

## 2019-05-09 DIAGNOSIS — Z23 Encounter for immunization: Secondary | ICD-10-CM | POA: Diagnosis not present

## 2019-05-09 DIAGNOSIS — F29 Unspecified psychosis not due to a substance or known physiological condition: Secondary | ICD-10-CM

## 2019-05-09 DIAGNOSIS — F309 Manic episode, unspecified: Secondary | ICD-10-CM

## 2019-05-09 DIAGNOSIS — F39 Unspecified mood [affective] disorder: Secondary | ICD-10-CM

## 2019-05-09 DIAGNOSIS — R443 Hallucinations, unspecified: Secondary | ICD-10-CM

## 2019-05-09 LAB — POCT GLYCOSYLATED HEMOGLOBIN (HGB A1C): Hemoglobin A1C: 5.2 % (ref 4.0–5.6)

## 2019-05-09 MED ORDER — NYSTATIN 100000 UNIT/GM EX POWD: Freq: Four times a day (QID) | CUTANEOUS | 5 refills | Status: AC

## 2019-05-09 MED ORDER — GENERIC EXTERNAL MEDICATION
Status: DC
Start: ? — End: 2019-05-09

## 2019-05-09 NOTE — Patient Instructions (Addendum)
Take 1/2 tablet of vistaril as needed for anxiety.  Add paxil 1/2 tablet then increase to full tablet after 7 days.  Increase trazodone to 1 and 1/2 tablet before bed.  Create a good sleep routine.
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
## 2019-05-13 ENCOUNTER — Encounter: Payer: Self-pay | Admitting: Obstetrics & Gynecology

## 2019-05-13 ENCOUNTER — Other Ambulatory Visit: Payer: Self-pay

## 2019-05-13 ENCOUNTER — Ambulatory Visit (INDEPENDENT_AMBULATORY_CARE_PROVIDER_SITE_OTHER): Payer: 59 | Admitting: Obstetrics & Gynecology

## 2019-05-13 VITALS — BP 145/92 | HR 54 | Resp 16 | Ht 67.0 in | Wt 238.0 lb

## 2019-05-13 DIAGNOSIS — Z124 Encounter for screening for malignant neoplasm of cervix: Secondary | ICD-10-CM

## 2019-05-13 DIAGNOSIS — N841 Polyp of cervix uteri: Secondary | ICD-10-CM

## 2019-05-13 DIAGNOSIS — N920 Excessive and frequent menstruation with regular cycle: Secondary | ICD-10-CM

## 2019-05-13 DIAGNOSIS — K219 Gastro-esophageal reflux disease without esophagitis: Secondary | ICD-10-CM | POA: Insufficient documentation

## 2019-05-13 DIAGNOSIS — N946 Dysmenorrhea, unspecified: Secondary | ICD-10-CM

## 2019-05-13 NOTE — Progress Notes (Signed)
Subjective:    Patient ID: Gail Black, female    DOB: [birth year], 48 y.o.   MRN: [id number]  HPI  48 yo female presents for menorrhagia.  Pt had covid 19 in March.  Dec 11, 2017 admitted for stroke.  Pt has lost a lot of weight (almost 100 pounds) and found out she had type 1 diabetes.  Pt admitted for severe abdominal pain and N/V for 6 days--diagnosed with with bleeding stomach ulcer.   Pt has monthly menses that lasts 12 days.  Very heavy, soaks pads and tampons.  Was heavy since May.  No pain prior to May and now has dysmenorrhea.  Pt had [place] at [facility] and found to have nml uterus with small sub-centimeter fibroid.  Lining was 5.3 mm.  Pt diagnosed with PCO and had long periods of amenorrhea earlier in life.  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     Review of Systems  Constitutional: Negative.   Respiratory: Negative.   Cardiovascular: Negative.   Gastrointestinal: Positive for abdominal pain, diarrhea and nausea.  Genitourinary: Positive for dyspareunia and menstrual problem.  Skin: Negative.   Psychiatric/Behavioral: Negative.        CBC    Component Value Date/Time   WBC 8.6 03/13/2019 0924   RBC 4.85 03/13/2019 0924   HGB 13.6 03/13/2019 0924   HCT 41.1 03/13/2019 0924   PLT 245 03/13/2019 0924   MCV 84.7 03/13/2019 0924   MCH 28.0 03/13/2019 0924   MCHC 33.1 03/13/2019 0924   RDW 14.7 03/13/2019 0924   LYMPHSABS 1,763 03/13/2019 0924   EOSABS 292 03/13/2019 0924   BASOSABS 52 03/13/2019 0924    Objective:   Physical Exam Vitals signs reviewed.  Constitutional:      General: She is not in acute distress.    Appearance: She is well-developed.  HENT:     Head: Normocephalic and atraumatic.  Eyes:     Conjunctiva/sclera: Conjunctivae normal.  Cardiovascular:     Rate and Rhythm: Normal rate.  Pulmonary:     Effort: Pulmonary effort is normal.  Abdominal:     Palpations: Abdomen is soft.     Tenderness: There is no abdominal tenderness.  Genitourinary:   General: Normal vulva.     Comments: Vagina is nml with no discharge nor blood Cervix has 1 cm endocervical polyp Uterus nml and limited by habitus.  Skin:    General: Skin is warm and dry.  Neurological:     Mental Status: She is alert and oriented to person, place, and time.       Assessment & Plan:  48 yo female with menorrhagia and dysmenorrhea since May as well as mother and sister with BRCA +.  1.  Reviewed records and results 2.  Discussed heriditary breast/ovarian cancer 3.  Endometrial biospy 4.  Endocervical polp removal 5.  Plan on Mirena IUD for menorrhagia symptoms.  If biopsy indicates further treatment will alter plan.  If BRCA + will refer for counseling and further treatment.  ENDOMETRIAL BIOPSY     The indications for endometrial biopsy were reviewed.   Risks of the biopsy including cramping, bleeding, infection, uterine perforation, inadequate specimen and need for additional procedures  were discussed. The patient states she understands and agrees to undergo procedure today. Consent was signed. Time out was performed. Urine HCG was negative. A sterile speculum was placed in the patient's vagina and the cervix was prepped with Betadine. A single-toothed tenaculum was placed on the anterior  lip of the cervix to stabilize it. The 3 mm pipelle was introduced into the endometrial cavity without difficulty to a depth of 9cm, and a moderate amount of tissue was obtained and sent to pathology. The instruments were removed from the patient's vagina. Minimal bleeding from the cervix was noted. The patient tolerated the procedure well. Routine post-procedure instructions were given to the patient. The patient will follow up to review the results and for further management.    Removal of endocervical polyp  Procedure explained in detail and informed consent obtained.  Risks include bleeding and infection.  one centimeter polyp grasped with ring forceps and twisted off.  Specimen  handed off to RN.  Hemostasis controlled with silver nitrate.

## 2019-05-16 ENCOUNTER — Ambulatory Visit (HOSPITAL_COMMUNITY): Payer: 59 | Admitting: Psychiatry

## 2019-05-20 ENCOUNTER — Encounter: Payer: Self-pay | Admitting: Physician Assistant

## 2019-05-20 ENCOUNTER — Emergency Department: Admission: EM | Admit: 2019-05-20 | Discharge: 2019-05-20 | Payer: 59 | Source: Home / Self Care

## 2019-05-28 ENCOUNTER — Other Ambulatory Visit: Payer: Self-pay | Admitting: Physician Assistant

## 2019-05-28 DIAGNOSIS — E1169 Type 2 diabetes mellitus with other specified complication: Secondary | ICD-10-CM

## 2019-05-28 DIAGNOSIS — I10 Essential (primary) hypertension: Secondary | ICD-10-CM

## 2019-05-28 DIAGNOSIS — I63313 Cerebral infarction due to thrombosis of bilateral middle cerebral arteries: Secondary | ICD-10-CM

## 2019-05-28 DIAGNOSIS — F419 Anxiety disorder, unspecified: Secondary | ICD-10-CM

## 2019-05-28 DIAGNOSIS — I152 Hypertension secondary to endocrine disorders: Secondary | ICD-10-CM

## 2019-05-28 DIAGNOSIS — E785 Hyperlipidemia, unspecified: Secondary | ICD-10-CM

## 2019-05-28 DIAGNOSIS — E1159 Type 2 diabetes mellitus with other circulatory complications: Secondary | ICD-10-CM

## 2019-05-28 LAB — HM COLONOSCOPY

## 2019-06-03 ENCOUNTER — Other Ambulatory Visit: Payer: Self-pay

## 2019-06-03 ENCOUNTER — Ambulatory Visit (HOSPITAL_COMMUNITY): Payer: 59 | Admitting: Psychiatry

## 2019-06-04 ENCOUNTER — Ambulatory Visit (INDEPENDENT_AMBULATORY_CARE_PROVIDER_SITE_OTHER): Payer: 59 | Admitting: Professional

## 2019-06-04 DIAGNOSIS — F331 Major depressive disorder, recurrent, moderate: Secondary | ICD-10-CM | POA: Diagnosis not present

## 2019-06-05 ENCOUNTER — Encounter (HOSPITAL_COMMUNITY): Payer: Self-pay | Admitting: Psychiatry

## 2019-06-05 ENCOUNTER — Other Ambulatory Visit: Payer: Self-pay

## 2019-06-05 ENCOUNTER — Encounter: Payer: Self-pay | Admitting: Physician Assistant

## 2019-06-05 ENCOUNTER — Ambulatory Visit (INDEPENDENT_AMBULATORY_CARE_PROVIDER_SITE_OTHER): Payer: Managed Care, Other (non HMO) | Admitting: Psychiatry

## 2019-06-05 VITALS — Ht 67.0 in | Wt 194.0 lb

## 2019-06-05 DIAGNOSIS — F5102 Adjustment insomnia: Secondary | ICD-10-CM

## 2019-06-05 DIAGNOSIS — F0630 Mood disorder due to known physiological condition, unspecified: Secondary | ICD-10-CM

## 2019-06-05 DIAGNOSIS — F411 Generalized anxiety disorder: Secondary | ICD-10-CM | POA: Diagnosis not present

## 2019-06-05 DIAGNOSIS — D126 Benign neoplasm of colon, unspecified: Secondary | ICD-10-CM | POA: Insufficient documentation

## 2019-06-05 DIAGNOSIS — F29 Unspecified psychosis not due to a substance or known physiological condition: Secondary | ICD-10-CM

## 2019-06-05 DIAGNOSIS — F331 Major depressive disorder, recurrent, moderate: Secondary | ICD-10-CM

## 2019-06-05 DIAGNOSIS — R4182 Altered mental status, unspecified: Secondary | ICD-10-CM

## 2019-06-05 DIAGNOSIS — R443 Hallucinations, unspecified: Secondary | ICD-10-CM

## 2019-06-05 DIAGNOSIS — F309 Manic episode, unspecified: Secondary | ICD-10-CM

## 2019-06-05 MED ORDER — RISPERIDONE 0.5 MG PO TABS: 0.5000 mg | ORAL_TABLET | Freq: Two times a day (BID) | ORAL | 0 refills | Status: DC
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
## 2019-06-14 ENCOUNTER — Ambulatory Visit (INDEPENDENT_AMBULATORY_CARE_PROVIDER_SITE_OTHER): Payer: 59 | Admitting: Professional

## 2019-06-14 DIAGNOSIS — F331 Major depressive disorder, recurrent, moderate: Secondary | ICD-10-CM

## 2019-06-17 ENCOUNTER — Encounter: Payer: Self-pay | Admitting: Physician Assistant

## 2019-06-19 ENCOUNTER — Ambulatory Visit: Payer: 59 | Admitting: Physician Assistant

## 2019-06-19 ENCOUNTER — Other Ambulatory Visit: Payer: Self-pay

## 2019-06-19 VITALS — BP 164/90 | HR 54 | Ht 67.5 in | Wt 248.0 lb

## 2019-06-19 DIAGNOSIS — R635 Abnormal weight gain: Secondary | ICD-10-CM

## 2019-06-19 DIAGNOSIS — I110 Hypertensive heart disease with heart failure: Secondary | ICD-10-CM | POA: Diagnosis not present

## 2019-06-19 DIAGNOSIS — I10 Essential (primary) hypertension: Secondary | ICD-10-CM

## 2019-06-19 DIAGNOSIS — E1159 Type 2 diabetes mellitus with other circulatory complications: Secondary | ICD-10-CM

## 2019-06-19 DIAGNOSIS — I63313 Cerebral infarction due to thrombosis of bilateral middle cerebral arteries: Secondary | ICD-10-CM

## 2019-06-19 DIAGNOSIS — I43 Cardiomyopathy in diseases classified elsewhere: Secondary | ICD-10-CM

## 2019-06-19 DIAGNOSIS — E119 Type 2 diabetes mellitus without complications: Secondary | ICD-10-CM

## 2019-06-19 DIAGNOSIS — I152 Hypertension secondary to endocrine disorders: Secondary | ICD-10-CM

## 2019-06-19 MED ORDER — FREESTYLE LIBRE 14 DAY SENSOR MISC: 1.0000 | Freq: Four times a day (QID) | 11 refills | Status: DC | PRN

## 2019-06-19 MED ORDER — METFORMIN HCL 1000 MG PO TABS: 1000.0000 mg | ORAL_TABLET | Freq: Two times a day (BID) | ORAL | 0 refills | Status: DC

## 2019-06-19 MED ORDER — METOPROLOL TARTRATE 25 MG PO TABS: 12.5000 mg | ORAL_TABLET | Freq: Two times a day (BID) | ORAL | 1 refills | Status: AC

## 2019-06-19 MED ORDER — HYDROCHLOROTHIAZIDE 12.5 MG PO TABS: 12.5000 mg | ORAL_TABLET | Freq: Every day | ORAL | 1 refills | Status: AC

## 2019-06-19 MED ORDER — FREESTYLE LIBRE 14 DAY READER DEVI: 1.0000 | Freq: Four times a day (QID) | 0 refills | Status: AC | PRN

## 2019-06-19 NOTE — Patient Instructions (Signed)

## 2019-06-19 NOTE — Progress Notes (Signed)
Subjective:    Patient ID: Gail Black, female    DOB: [birth year], 48 y.o.   MRN: [id number]  HPI  Pt is a 48 yo female with HTN, hx of CVA, Mood disorder, T2DM who presents to the clinic for 4 week follow up.   She is doing better. She has seen Dr. De Nurse and has a follow up in next week or so. She feels like her medications are working but concerned about her weight gain. She has gained 10lbs in the last month.   Pt is not checking her BP. She is taking her medications as prescribed. She denies any CP, palpitations, headaches or vision changes.   .. Active Ambulatory Problems    Diagnosis Date Noted  . Morbid obesity ([place]) 11/16/2016  . PCOS (polycystic ovarian syndrome) 11/16/2016  . Hirsutism 11/22/2016  . Adjustment disorder with mixed anxiety and depressed mood 01/18/2017  . Menorrhagia with regular cycle 02/01/2017  . Dyslipidemia, goal LDL below 70 02/01/2017  . Type 2 diabetes mellitus with hyperglycemia, without long-term current use of insulin ([place]) 02/01/2017  . Family history of breast cancer gene mutation in first degree relative 02/01/2017  . Cellulitis of left upper extremity 12/17/2018  . 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 Vitamin D deficiency 12/17/2018  . Hypertensive cardiomyopathy, with heart failure ([place]) 01/01/2019  . Hypertension associated with diabetes ([place]) 01/01/2019  . Uncontrolled type 2 diabetes mellitus with hypoglycemia without coma ([place]) 01/01/2019  . Mood disorder ([place]) 01/01/2019  . Anxiety 01/01/2019  . Drug-induced nausea and vomiting 02/24/2019  . Hypoglycemia due to insulin 02/24/2019  . Poor venous access 03/13/2019  . Leg cramping 03/13/2019  . Left lower quadrant abdominal pain 03/13/2019  . Bilious vomiting with nausea 03/13/2019  . Sigmoid diverticulosis 03/13/2019  . 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 Pars defect with spondylolisthesis  03/13/2019  . Family history of diabetes mellitus type I 05/09/2019  . Candidal intertrigo 05/09/2019  . Mania ([place]) 05/09/2019  . Hallucination 05/09/2019  . Altered mental status 05/09/2019  . Gastroesophageal reflux disease 05/13/2019  . Adenomatous colon polyp 06/05/2019  . Weight gain 06/26/2019   Resolved Ambulatory Problems    Diagnosis Date Noted  . Acute sinusitis, unspecified 05/11/2009  . Acute pharyngitis 10/06/2009  . UNSPECIFIED CELLULITIS AND ABSCESS OF TOE 04/28/2009  . Severe uncontrolled hypertension 11/16/2016   Past Medical History:  Diagnosis Date  . 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 History of postpartum depression   . Hypertension   . Obesity   . Preeclampsia      Review of Systems  All other systems reviewed and are negative.      Objective:   Physical Exam Vitals signs reviewed.  Constitutional:      Appearance: Normal appearance. She is obese.  HENT:     Head: Normocephalic.  Cardiovascular:     Rate and Rhythm: Normal rate and regular rhythm.     Pulses: Normal pulses.  Pulmonary:     Effort: Pulmonary effort is normal.     Breath sounds: Normal breath sounds.  Neurological:     General: No focal deficit present.     Mental Status: She is alert and oriented to person, place, and time.  Psychiatric:        Mood and Affect: Mood normal.           Assessment & Plan:  Marland KitchenMarland KitchenMisty was seen today for  follow-up.  Diagnoses and all orders for this visit:  Weight gain  Hypertensive cardiomyopathy, with heart failure (HCC) -     metoprolol tartrate (LOPRESSOR) 25 MG tablet; Take 0.5 tablets (12.5 mg total) by mouth 2 (two) times daily.  Type II diabetes mellitus, well controlled ([place]) -     Continuous Blood Gluc Sensor (FREESTYLE LIBRE 14 DAY SENSOR) MISC; 1 Device by Does not apply route 4 (four) times daily as needed. -     Continuous Blood Gluc Receiver (FREESTYLE LIBRE 14 DAY READER) DEVI; 1 Device by Does not apply route 4  (four) times daily as needed. -     metFORMIN (GLUCOPHAGE) 1000 MG tablet; Take 1 tablet (1,000 mg total) by mouth 2 (two) times daily with a meal.  Hypertension associated with diabetes (HCC) -     hydrochlorothiazide (HYDRODIURIL) 12.5 MG tablet; Take 1 tablet (12.5 mg total) by mouth daily.  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 On norvasc/metoprolol/diovian. Added HcTZ. Discussed side effects. Keep BP log. Discussed low salt diet. Follow up in 4 weeks.   Blood sugars great. Continue metformin.   Continue to follow up with [facility] for mood. Discuss weight gain as medication side effect.   Marland Kitchen.Discussed low carb diet with 1500 calories and 80g of protein.  Exercising at least 150 minutes a week.  [employer]itness Pal could be a Microbiologist.

## 2019-06-21 ENCOUNTER — Ambulatory Visit (INDEPENDENT_AMBULATORY_CARE_PROVIDER_SITE_OTHER): Payer: 59 | Admitting: Professional

## 2019-06-21 DIAGNOSIS — F331 Major depressive disorder, recurrent, moderate: Secondary | ICD-10-CM

## 2019-06-21 DIAGNOSIS — Z91411 Personal history of adult psychological abuse: Secondary | ICD-10-CM

## 2019-06-26 ENCOUNTER — Encounter: Payer: Self-pay | Admitting: Physician Assistant

## 2019-06-26 DIAGNOSIS — R635 Abnormal weight gain: Secondary | ICD-10-CM | POA: Insufficient documentation

## 2019-06-28 ENCOUNTER — Ambulatory Visit: Payer: Managed Care, Other (non HMO) | Admitting: Professional

## 2019-07-02 ENCOUNTER — Other Ambulatory Visit (HOSPITAL_COMMUNITY): Payer: Self-pay | Admitting: Psychiatry

## 2019-07-02 DIAGNOSIS — R4182 Altered mental status, unspecified: Secondary | ICD-10-CM

## 2019-07-02 DIAGNOSIS — F29 Unspecified psychosis not due to a substance or known physiological condition: Secondary | ICD-10-CM

## 2019-07-02 DIAGNOSIS — R443 Hallucinations, unspecified: Secondary | ICD-10-CM

## 2019-07-02 DIAGNOSIS — F309 Manic episode, unspecified: Secondary | ICD-10-CM

## 2019-07-08 ENCOUNTER — Ambulatory Visit (INDEPENDENT_AMBULATORY_CARE_PROVIDER_SITE_OTHER): Payer: 59 | Admitting: Psychiatry

## 2019-07-08 ENCOUNTER — Encounter (HOSPITAL_COMMUNITY): Payer: Self-pay | Admitting: Psychiatry

## 2019-07-08 DIAGNOSIS — F331 Major depressive disorder, recurrent, moderate: Secondary | ICD-10-CM | POA: Diagnosis not present

## 2019-07-08 DIAGNOSIS — F0630 Mood disorder due to known physiological condition, unspecified: Secondary | ICD-10-CM | POA: Diagnosis not present

## 2019-07-08 DIAGNOSIS — R443 Hallucinations, unspecified: Secondary | ICD-10-CM | POA: Diagnosis not present

## 2019-07-08 DIAGNOSIS — F411 Generalized anxiety disorder: Secondary | ICD-10-CM | POA: Diagnosis not present
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
## 2019-07-11 ENCOUNTER — Telehealth: Payer: Self-pay | Admitting: Neurology

## 2019-07-11 DIAGNOSIS — F419 Anxiety disorder, unspecified: Secondary | ICD-10-CM

## 2019-07-11 DIAGNOSIS — I110 Hypertensive heart disease with heart failure: Secondary | ICD-10-CM

## 2019-07-11 DIAGNOSIS — E1159 Type 2 diabetes mellitus with other circulatory complications: Secondary | ICD-10-CM

## 2019-07-11 DIAGNOSIS — I152 Hypertension secondary to endocrine disorders: Secondary | ICD-10-CM

## 2019-07-11 DIAGNOSIS — E119 Type 2 diabetes mellitus without complications: Secondary | ICD-10-CM

## 2019-07-11 MED ORDER — FREESTYLE LIBRE 14 DAY SENSOR MISC: 1.0000 | Freq: Four times a day (QID) | 11 refills | Status: AC | PRN

## 2019-07-11 MED ORDER — AMLODIPINE BESYLATE 10 MG PO TABS: 10.0000 mg | ORAL_TABLET | Freq: Every day | ORAL | 0 refills | Status: AC

## 2019-07-11 MED ORDER — PAROXETINE HCL 30 MG PO TABS: 30.0000 mg | ORAL_TABLET | Freq: Every day | ORAL | 0 refills | Status: AC

## 2019-07-11 MED ORDER — METFORMIN HCL 1000 MG PO TABS: 1000.0000 mg | ORAL_TABLET | Freq: Two times a day (BID) | ORAL | 0 refills | Status: DC

## 2019-07-11 MED ORDER — TRAZODONE HCL 100 MG PO TABS: 100.0000 mg | ORAL_TABLET | Freq: Every day | ORAL | 0 refills | Status: AC

## 2019-07-11 MED ORDER — VALSARTAN 160 MG PO TABS: 160.0000 mg | ORAL_TABLET | Freq: Two times a day (BID) | ORAL | 0 refills | Status: AC

## 2019-07-11 MED ORDER — INSULIN PEN NEEDLE 32G X 4 MM MISC: 1 refills | Status: DC

## 2019-07-11 NOTE — Telephone Encounter (Signed)
Patient requested refills of multiple medications sent to [place], previously at mail order pharmacy. Medications sent. She was transferred to make 4 week follow up with front desk.

## 2019-07-12 ENCOUNTER — Ambulatory Visit: Payer: 59 | Admitting: Professional

## 2019-07-18 ENCOUNTER — Ambulatory Visit: Payer: 59 | Admitting: Professional

## 2019-07-19 ENCOUNTER — Ambulatory Visit (INDEPENDENT_AMBULATORY_CARE_PROVIDER_SITE_OTHER): Payer: 59 | Admitting: Professional

## 2019-07-19 DIAGNOSIS — F331 Major depressive disorder, recurrent, moderate: Secondary | ICD-10-CM

## 2019-07-26 ENCOUNTER — Ambulatory Visit (INDEPENDENT_AMBULATORY_CARE_PROVIDER_SITE_OTHER): Payer: 59 | Admitting: Professional

## 2019-07-26 DIAGNOSIS — F331 Major depressive disorder, recurrent, moderate: Secondary | ICD-10-CM

## 2019-08-05 ENCOUNTER — Other Ambulatory Visit (HOSPITAL_COMMUNITY): Payer: Self-pay

## 2019-08-05 DIAGNOSIS — R4182 Altered mental status, unspecified: Secondary | ICD-10-CM

## 2019-08-05 DIAGNOSIS — F29 Unspecified psychosis not due to a substance or known physiological condition: Secondary | ICD-10-CM

## 2019-08-05 DIAGNOSIS — R443 Hallucinations, unspecified: Secondary | ICD-10-CM

## 2019-08-05 DIAGNOSIS — F309 Manic episode, unspecified: Secondary | ICD-10-CM

## 2019-08-05 MED ORDER — RISPERIDONE 0.5 MG PO TABS: 0.5000 mg | ORAL_TABLET | Freq: Two times a day (BID) | ORAL | 0 refills | Status: DC

## 2019-08-19 ENCOUNTER — Ambulatory Visit (HOSPITAL_COMMUNITY): Payer: 59 | Admitting: Psychiatry

## 2019-08-19 ENCOUNTER — Other Ambulatory Visit: Payer: Self-pay

## 2019-08-22 ENCOUNTER — Ambulatory Visit (INDEPENDENT_AMBULATORY_CARE_PROVIDER_SITE_OTHER): Payer: 59 | Admitting: Professional

## 2019-08-22 DIAGNOSIS — F331 Major depressive disorder, recurrent, moderate: Secondary | ICD-10-CM | POA: Diagnosis not present

## 2019-09-02 ENCOUNTER — Other Ambulatory Visit: Payer: Self-pay | Admitting: Physician Assistant

## 2019-09-02 DIAGNOSIS — I43 Cardiomyopathy in diseases classified elsewhere: Secondary | ICD-10-CM

## 2019-09-02 DIAGNOSIS — I110 Hypertensive heart disease with heart failure: Secondary | ICD-10-CM

## 2019-09-04 ENCOUNTER — Other Ambulatory Visit: Payer: Self-pay | Admitting: Physician Assistant

## 2019-09-06 ENCOUNTER — Ambulatory Visit (INDEPENDENT_AMBULATORY_CARE_PROVIDER_SITE_OTHER): Payer: 59 | Admitting: Professional

## 2019-09-06 DIAGNOSIS — F331 Major depressive disorder, recurrent, moderate: Secondary | ICD-10-CM

## 2019-09-06 IMAGING — DX DG ANKLE COMPLETE 3+V*L*
3 series · 3 of 3 positions shown · non-contrast
Comparison: None.

CLINICAL DATA: Skin infection lateral left ankle for 1 week

EXAM:
LEFT ANKLE COMPLETE - 3+ VIEW

[ankle ap]
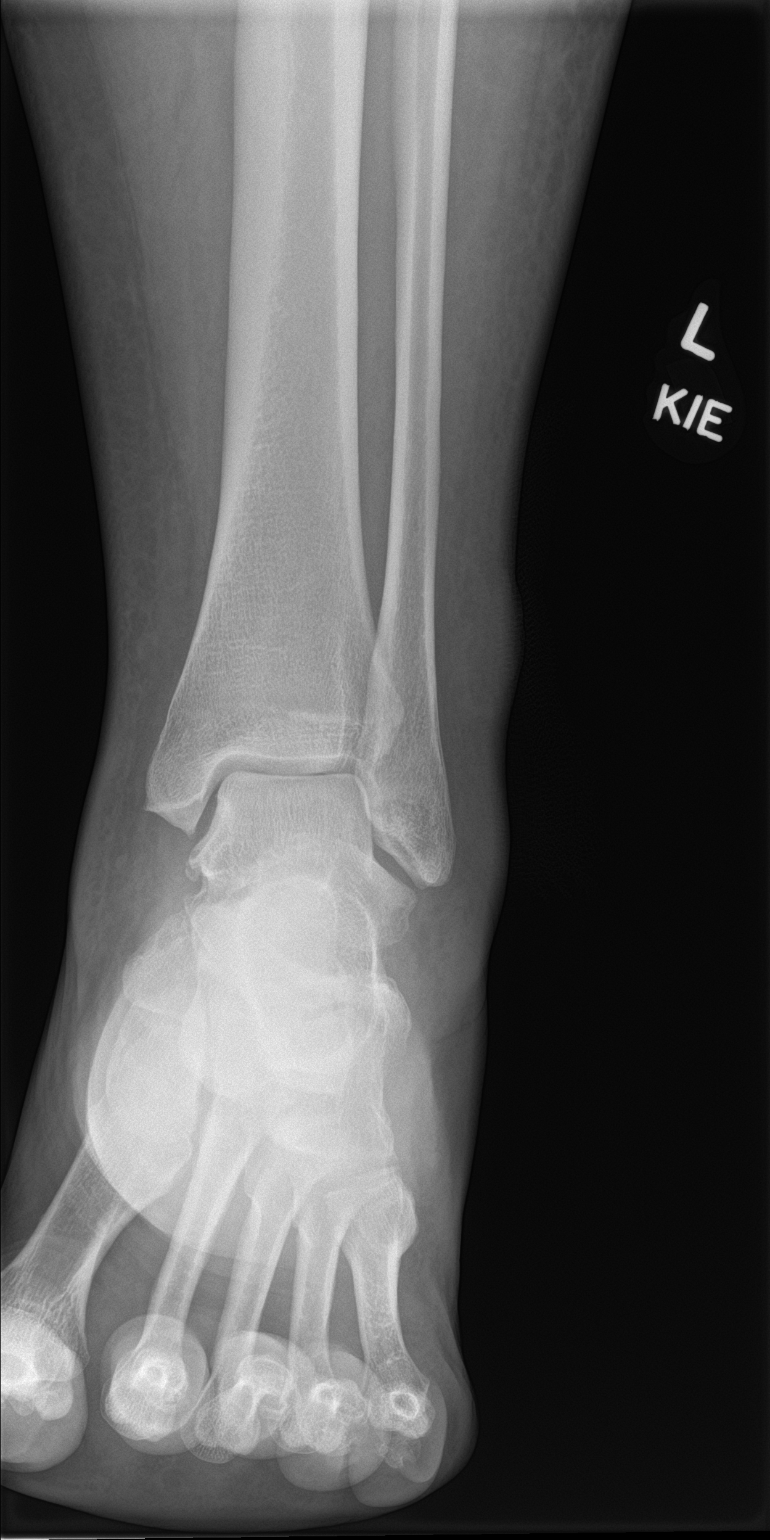

[ankle obl]
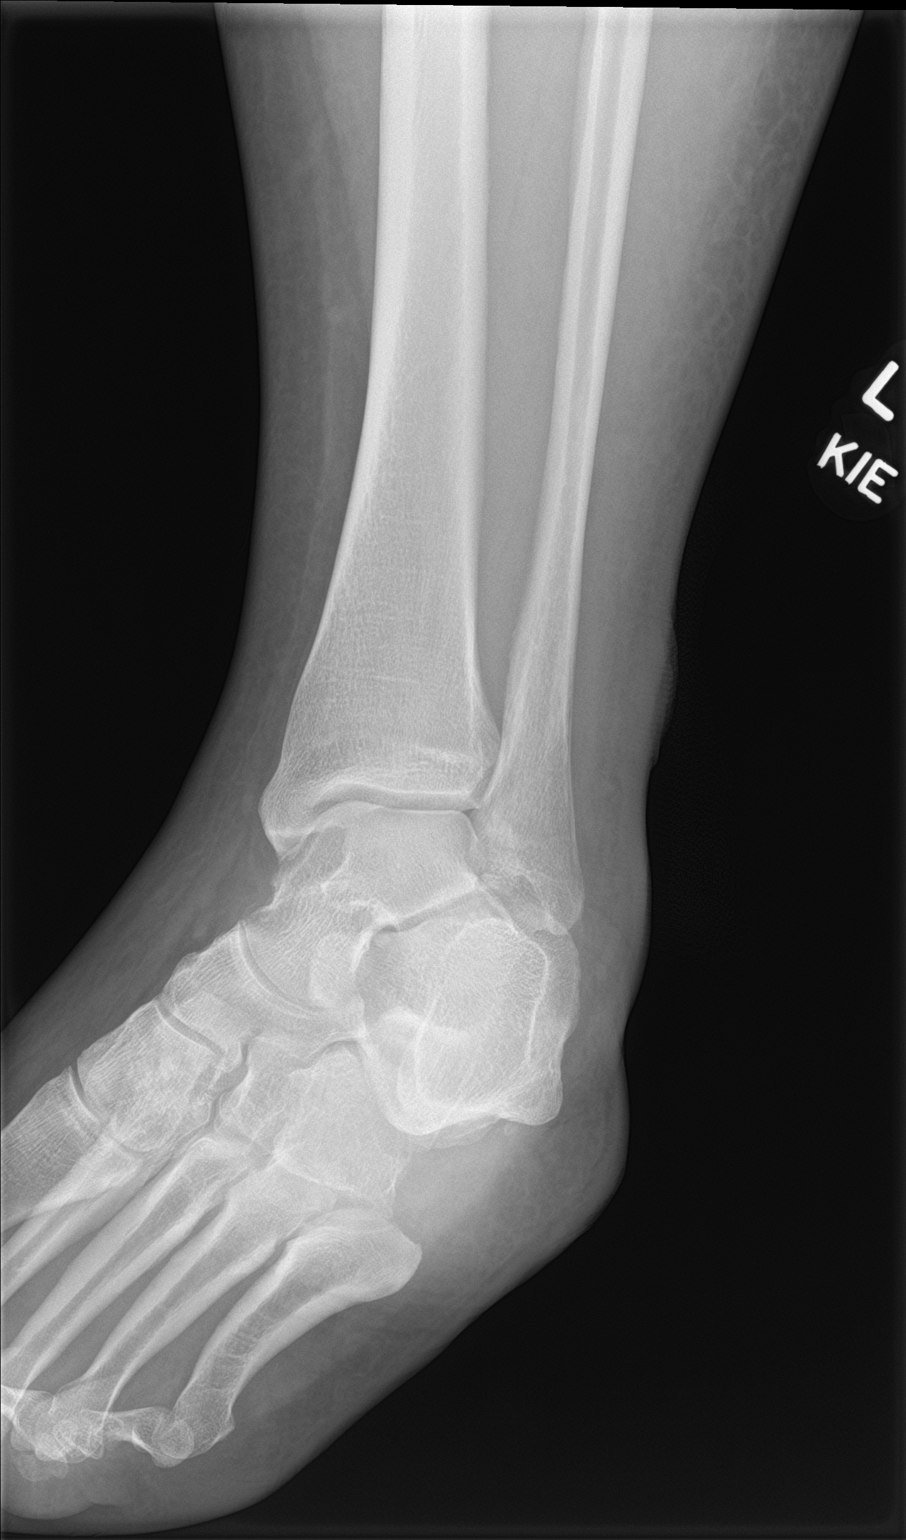

[ankle lat]
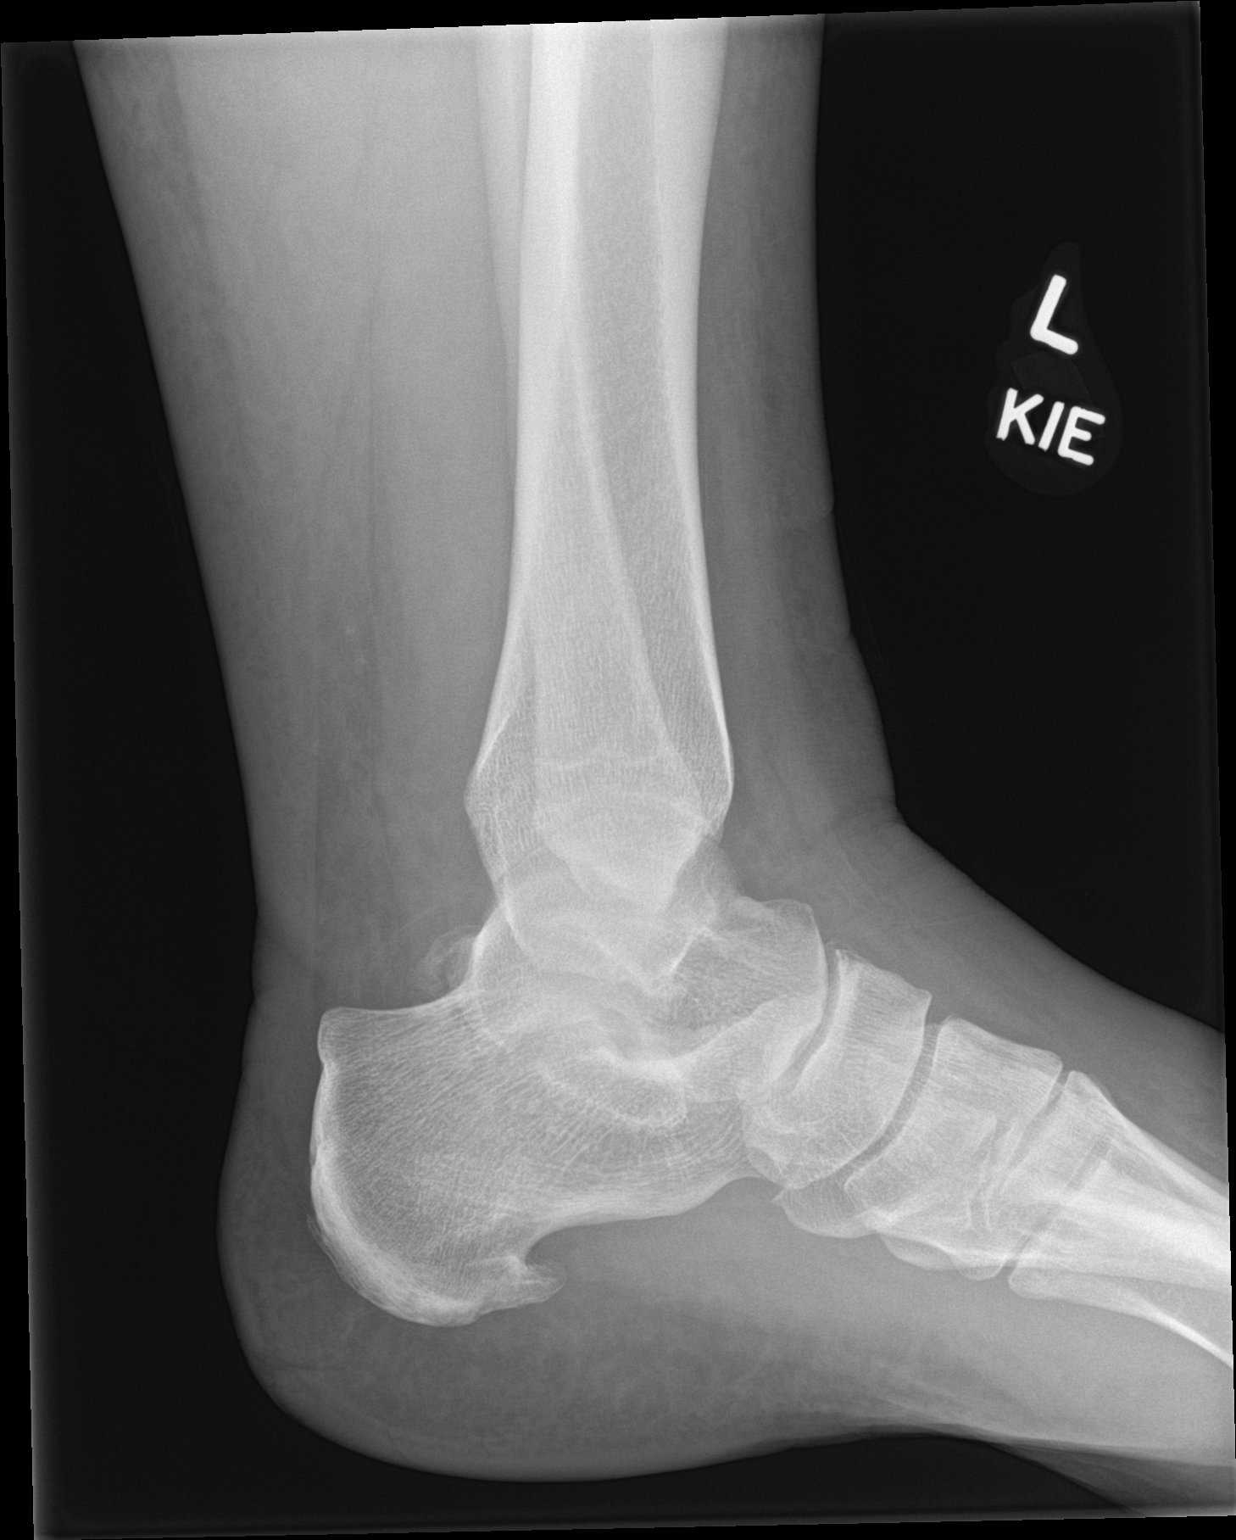

[3 of 3 positions shown; findings below may reference images not displayed]

FINDINGS: Diffuse soft tissue swelling. No soft tissue gas or radiopaque
foreign body. No acute bony abnormality. Specifically, no fracture,
subluxation, or dislocation. Plantar calcaneal spur. No radiographic
changes of osteomyelitis.
IMPRESSION: Diffuse soft tissue swelling.  No acute bony abnormality.

## 2019-09-20 ENCOUNTER — Ambulatory Visit: Payer: 59 | Admitting: Professional

## 2019-10-04 ENCOUNTER — Ambulatory Visit: Payer: Self-pay | Admitting: Professional

## 2019-10-18 ENCOUNTER — Ambulatory Visit (INDEPENDENT_AMBULATORY_CARE_PROVIDER_SITE_OTHER): Payer: 59 | Admitting: Professional

## 2019-10-18 DIAGNOSIS — F331 Major depressive disorder, recurrent, moderate: Secondary | ICD-10-CM

## 2019-10-24 ENCOUNTER — Other Ambulatory Visit: Payer: Self-pay | Admitting: Physician Assistant

## 2019-10-24 DIAGNOSIS — E119 Type 2 diabetes mellitus without complications: Secondary | ICD-10-CM

## 2019-11-01 ENCOUNTER — Other Ambulatory Visit (HOSPITAL_COMMUNITY): Payer: Self-pay | Admitting: Psychiatry

## 2019-11-01 ENCOUNTER — Ambulatory Visit: Payer: 59 | Admitting: Professional

## 2019-11-01 DIAGNOSIS — F309 Manic episode, unspecified: Secondary | ICD-10-CM

## 2019-11-01 DIAGNOSIS — R443 Hallucinations, unspecified: Secondary | ICD-10-CM

## 2019-11-01 DIAGNOSIS — R4182 Altered mental status, unspecified: Secondary | ICD-10-CM

## 2019-11-01 DIAGNOSIS — F29 Unspecified psychosis not due to a substance or known physiological condition: Secondary | ICD-10-CM

## 2019-11-15 ENCOUNTER — Ambulatory Visit: Payer: 59 | Admitting: Professional

## 2019-11-24 ENCOUNTER — Other Ambulatory Visit: Payer: Self-pay | Admitting: Physician Assistant

## 2019-11-24 DIAGNOSIS — E119 Type 2 diabetes mellitus without complications: Secondary | ICD-10-CM

## 2019-11-27 ENCOUNTER — Other Ambulatory Visit (HOSPITAL_COMMUNITY): Payer: Self-pay | Admitting: Psychiatry

## 2019-11-27 DIAGNOSIS — F29 Unspecified psychosis not due to a substance or known physiological condition: Secondary | ICD-10-CM

## 2019-11-27 DIAGNOSIS — R4182 Altered mental status, unspecified: Secondary | ICD-10-CM

## 2019-11-27 DIAGNOSIS — F309 Manic episode, unspecified: Secondary | ICD-10-CM

## 2019-11-27 DIAGNOSIS — R443 Hallucinations, unspecified: Secondary | ICD-10-CM

## 2019-11-29 ENCOUNTER — Ambulatory Visit: Payer: 59 | Admitting: Professional

## 2019-12-13 ENCOUNTER — Ambulatory Visit: Payer: 59 | Admitting: Professional

## 2019-12-18 ENCOUNTER — Other Ambulatory Visit: Payer: Self-pay | Admitting: Physician Assistant

## 2019-12-18 DIAGNOSIS — I110 Hypertensive heart disease with heart failure: Secondary | ICD-10-CM

## 2019-12-18 DIAGNOSIS — I43 Cardiomyopathy in diseases classified elsewhere: Secondary | ICD-10-CM

## 2019-12-21 ENCOUNTER — Other Ambulatory Visit: Payer: Self-pay | Admitting: Physician Assistant

## 2019-12-27 ENCOUNTER — Ambulatory Visit: Payer: 59 | Admitting: Professional

## 2020-01-20 ENCOUNTER — Other Ambulatory Visit: Payer: Self-pay | Admitting: Physician Assistant

## 2020-01-20 DIAGNOSIS — F419 Anxiety disorder, unspecified: Secondary | ICD-10-CM

## 2020-01-20 DIAGNOSIS — E11649 Type 2 diabetes mellitus with hypoglycemia without coma: Secondary | ICD-10-CM

## 2020-01-21 ENCOUNTER — Other Ambulatory Visit: Payer: Self-pay | Admitting: Physician Assistant

## 2020-01-21 DIAGNOSIS — E1159 Type 2 diabetes mellitus with other circulatory complications: Secondary | ICD-10-CM

## 2020-01-21 DIAGNOSIS — I152 Hypertension secondary to endocrine disorders: Secondary | ICD-10-CM

## 2020-04-26 ENCOUNTER — Other Ambulatory Visit: Payer: Self-pay | Admitting: Physician Assistant

## 2020-04-26 DIAGNOSIS — E11649 Type 2 diabetes mellitus with hypoglycemia without coma: Secondary | ICD-10-CM

## 2020-05-01 ENCOUNTER — Other Ambulatory Visit: Payer: Self-pay | Admitting: Physician Assistant

## 2020-05-01 DIAGNOSIS — E11649 Type 2 diabetes mellitus with hypoglycemia without coma: Secondary | ICD-10-CM

## 2020-08-27 IMAGING — CT CT ABDOMEN AND PELVIS WITH CONTRAST
2 of 5 series · 15 of 46 positions shown, 17 images · IV contrast (omnipaque)
Comparison: None.
COMPARISON: None.

Addendum:
CLINICAL DATA: Left lower quadrant abdominal pain and tenderness
for the past 2 days with nausea and vomiting.

EXAM:
CT ABDOMEN AND PELVIS WITH CONTRAST
TECHNIQUE: Multidetector CT imaging of the abdomen and pelvis was performed
using the standard protocol following bolus administration of
intravenous contrast.
CONTRAST:  100mL OMNIPAQUE IOHEXOL 300 MG/ML  SOLN

[Series 2: axial st · axial · 0.98mm/px · z∈[-652,-122]mm · 12 of 118 slices shown, 14 images]
[im 6/118  soft-tissue]
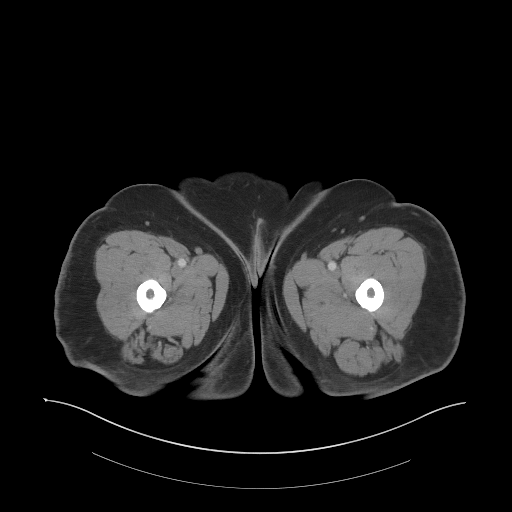
[im 6/118  bone]
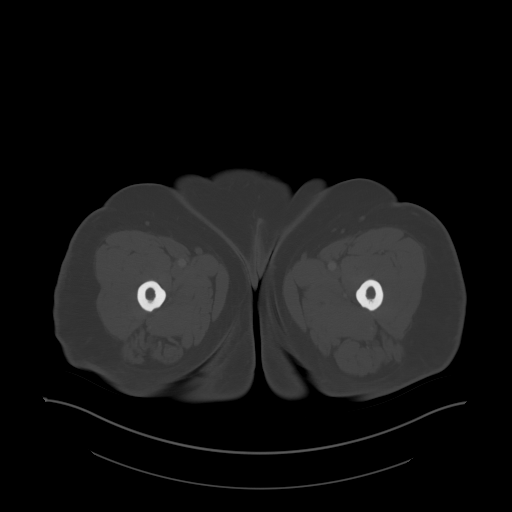
[im 17/118  soft-tissue]
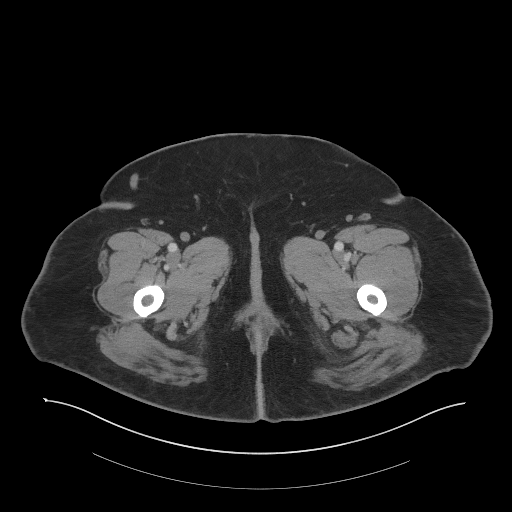
[im 28/118  soft-tissue]
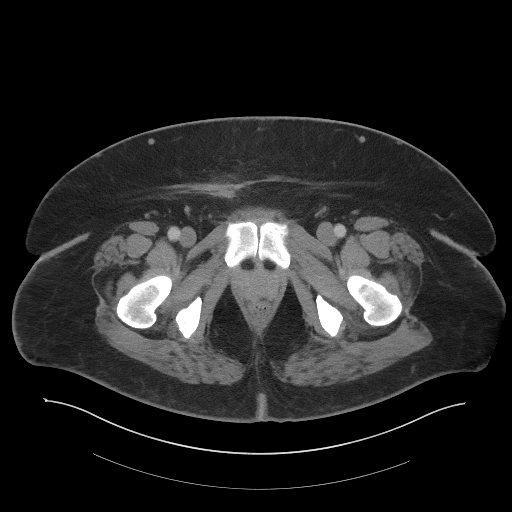
[im 34/118  soft-tissue]
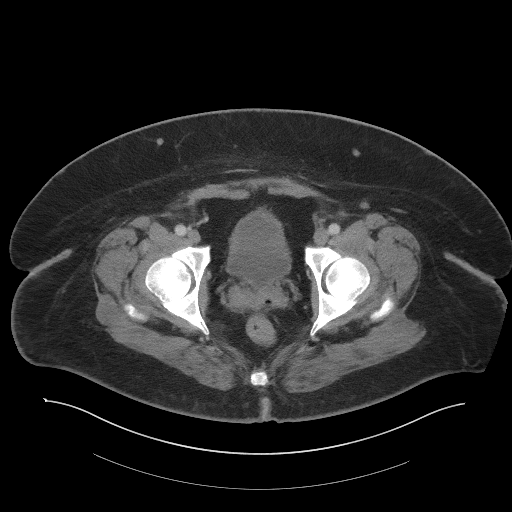
[im 45/118  soft-tissue]
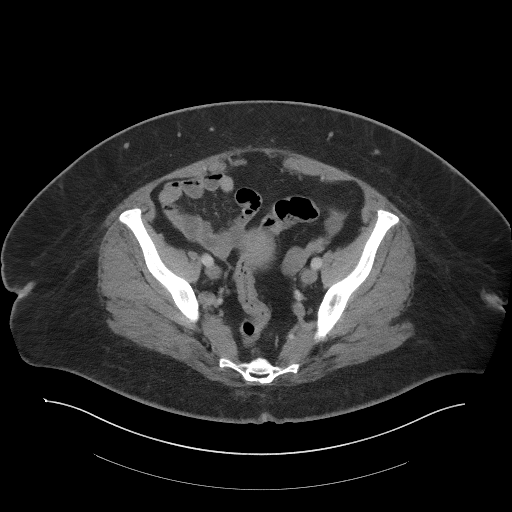
[im 56/118  soft-tissue]
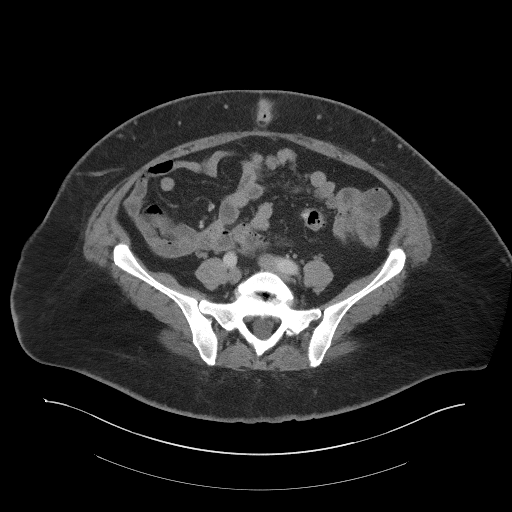
[im 62/118  soft-tissue]
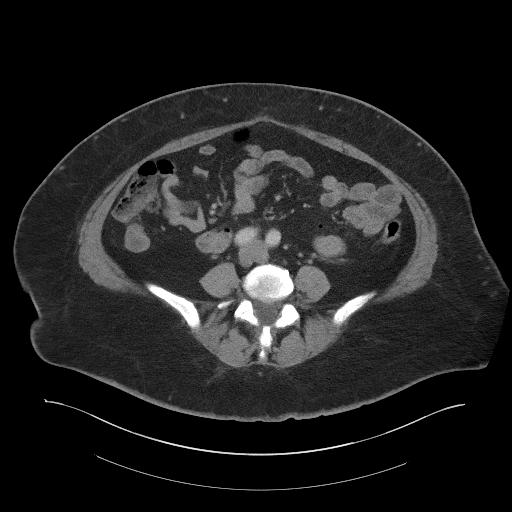
[im 73/118  soft-tissue]
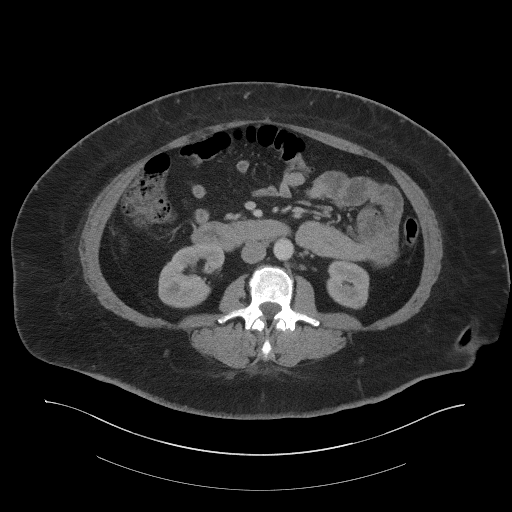
[im 84/118  soft-tissue]
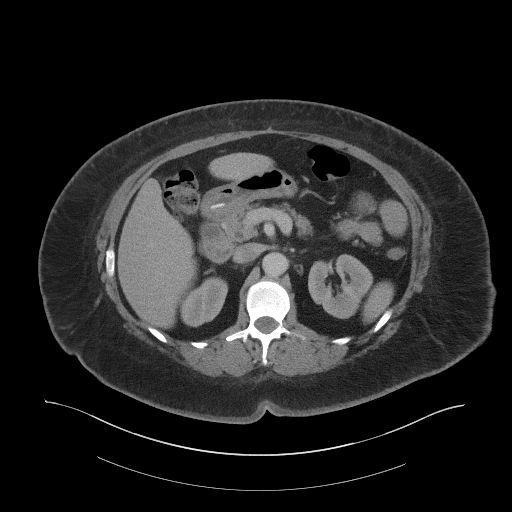
[im 84/118  bone]
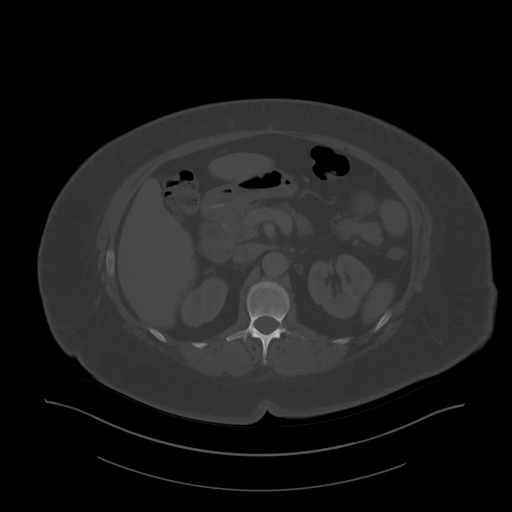
[im 90/118  soft-tissue]
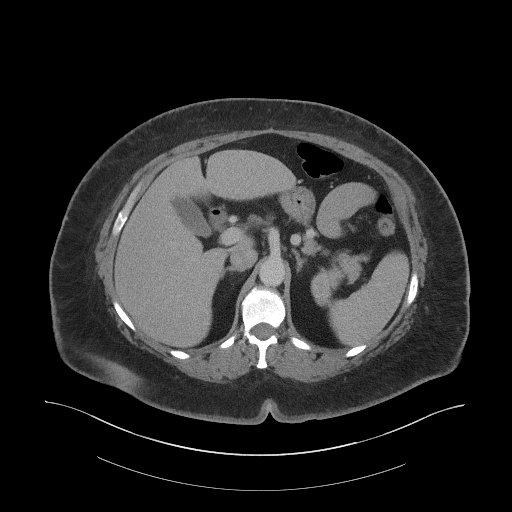
[im 101/118  soft-tissue]
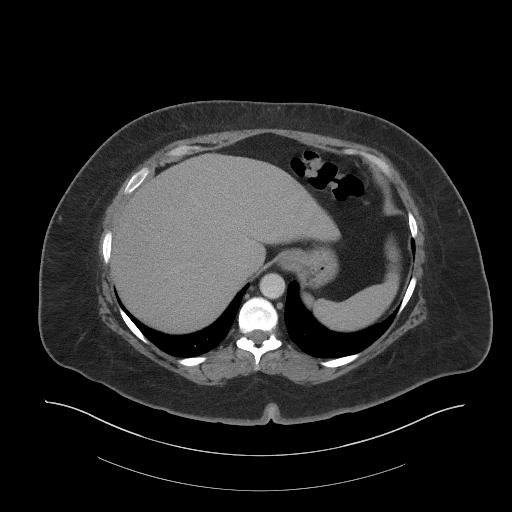
[im 112/118  soft-tissue]
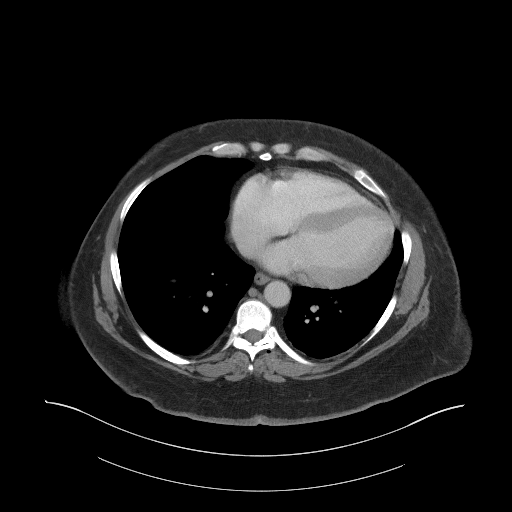

[Series 4: coronal st · coronal · 1.05mm/px · 3 of 121 slices shown]
[im 41/121  soft-tissue]
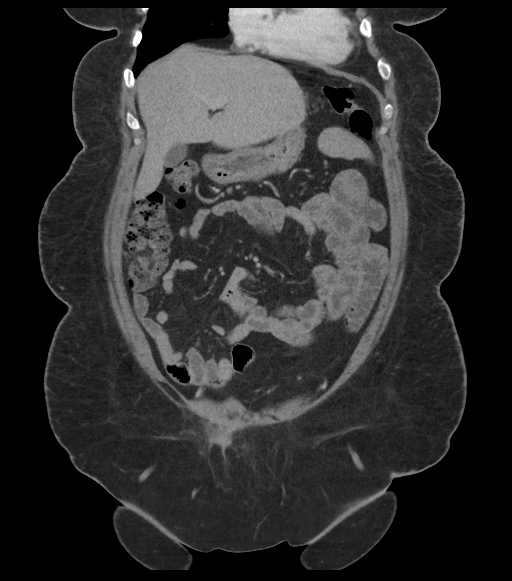
[im 54/121  soft-tissue]
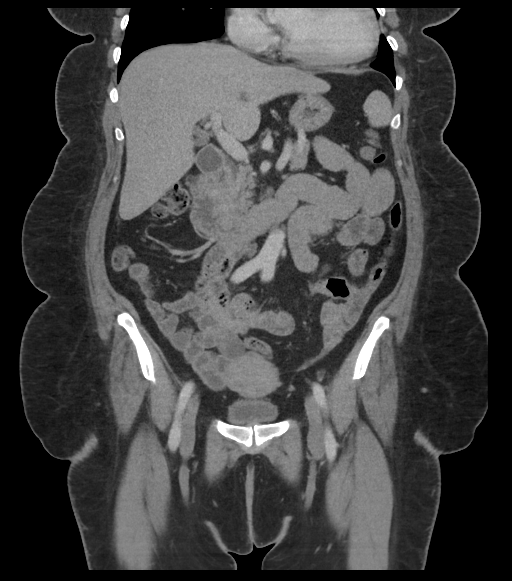
[im 67/121  soft-tissue]
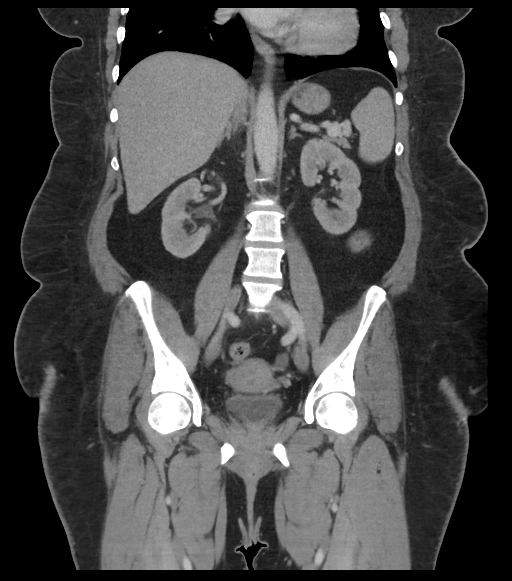

[15 of 46 positions shown; findings below may reference images not displayed]

FINDINGS: Lower chest: Minimal bilateral dependent atelectasis. Borderline
enlarged heart.

Hepatobiliary: No focal liver abnormality is seen. No gallstones,
gallbladder wall thickening, or biliary dilatation.

Pancreas: Unremarkable. No pancreatic ductal dilatation or
surrounding inflammatory changes.

Spleen: Normal in size without focal abnormality.

Adrenals/Urinary Tract: 2.0 cm oval, circumscribed, fat density
right adrenal mass. Normal appearing left adrenal gland, kidneys,
ureters and urinary bladder.

Stomach/Bowel: Multiple sigmoid colon diverticula without evidence
of diverticulitis. Normal appearing appendix, small bowel and
stomach.

Vascular/Lymphatic: No significant vascular findings are present. No
enlarged abdominal or pelvic lymph nodes.

Reproductive: Uterus and bilateral adnexa are unremarkable.

Other: Tiny umbilical hernia containing fat. C-section scar.

Musculoskeletal: Bilateral L5 pars interarticularis defects with
associated minimal anterolisthesis at the L5-S1 level. Mild lumbar
and lower thoracic spine degenerative changes.
IMPRESSION: 1. No acute abnormality.
2. Sigmoid diverticulosis.
3. 2.0 cm right adrenal myelolipoma.

ADDENDUM:
Also noted is bilateral L5 spondylolysis and minimal associated
grade 1 spondylolisthesis at the L5-S1 level.

*** End of Addendum ***
FINDINGS: Lower chest: Minimal bilateral dependent atelectasis. Borderline
enlarged heart.

Hepatobiliary: No focal liver abnormality is seen. No gallstones,
gallbladder wall thickening, or biliary dilatation.

Pancreas: Unremarkable. No pancreatic ductal dilatation or
surrounding inflammatory changes.

Spleen: Normal in size without focal abnormality.

Adrenals/Urinary Tract: 2.0 cm oval, circumscribed, fat density
right adrenal mass. Normal appearing left adrenal gland, kidneys,
ureters and urinary bladder.

Stomach/Bowel: Multiple sigmoid colon diverticula without evidence
of diverticulitis. Normal appearing appendix, small bowel and
stomach.

Vascular/Lymphatic: No significant vascular findings are present. No
enlarged abdominal or pelvic lymph nodes.

Reproductive: Uterus and bilateral adnexa are unremarkable.

Other: Tiny umbilical hernia containing fat. C-section scar.

Musculoskeletal: Bilateral L5 pars interarticularis defects with
associated minimal anterolisthesis at the L5-S1 level. Mild lumbar
and lower thoracic spine degenerative changes.
IMPRESSION: 1. No acute abnormality.
2. Sigmoid diverticulosis.
3. 2.0 cm right adrenal myelolipoma.
